# Patient Record
Sex: Male | Born: 1960 | Race: White | Hispanic: No | Marital: Married | Smoking: Current every day smoker
Health system: Southern US, Community
[De-identification: ages and names within clinical notes are randomized; demographics above are authoritative.]

## PROBLEM LIST (undated history)

## (undated) DIAGNOSIS — R112 Nausea with vomiting, unspecified: Secondary | ICD-10-CM

## (undated) DIAGNOSIS — T4145XA Adverse effect of unspecified anesthetic, initial encounter: Secondary | ICD-10-CM

## (undated) DIAGNOSIS — T8859XA Other complications of anesthesia, initial encounter: Secondary | ICD-10-CM

## (undated) DIAGNOSIS — I2510 Atherosclerotic heart disease of native coronary artery without angina pectoris: Secondary | ICD-10-CM

## (undated) DIAGNOSIS — J449 Chronic obstructive pulmonary disease, unspecified: Secondary | ICD-10-CM

## (undated) DIAGNOSIS — I10 Essential (primary) hypertension: Secondary | ICD-10-CM

## (undated) DIAGNOSIS — E781 Pure hyperglyceridemia: Secondary | ICD-10-CM

## (undated) DIAGNOSIS — E785 Hyperlipidemia, unspecified: Secondary | ICD-10-CM

## (undated) DIAGNOSIS — Z9889 Other specified postprocedural states: Secondary | ICD-10-CM

## (undated) DIAGNOSIS — E079 Disorder of thyroid, unspecified: Secondary | ICD-10-CM

## (undated) DIAGNOSIS — Z955 Presence of coronary angioplasty implant and graft: Secondary | ICD-10-CM

## (undated) DIAGNOSIS — I219 Acute myocardial infarction, unspecified: Secondary | ICD-10-CM

## (undated) HISTORY — PX: CORONARY STENT PLACEMENT: SHX1402

## (undated) HISTORY — PX: APPENDECTOMY: SHX54

## (undated) HISTORY — PX: EXTERNAL EAR SURGERY: SHX627

## (undated) HISTORY — DX: Pure hyperglyceridemia: E78.1

---

## 2010-06-08 DIAGNOSIS — Z955 Presence of coronary angioplasty implant and graft: Secondary | ICD-10-CM

## 2010-06-08 HISTORY — DX: Presence of coronary angioplasty implant and graft: Z95.5

## 2012-02-21 ENCOUNTER — Emergency Department (HOSPITAL_COMMUNITY)
Admission: EM | Admit: 2012-02-21 | Discharge: 2012-02-22 | Discharge status: Against Medical Advice | Payer: 59 | Attending: Emergency Medicine | Admitting: Emergency Medicine

## 2012-02-21 ENCOUNTER — Other Ambulatory Visit: Payer: Self-pay

## 2012-02-21 ENCOUNTER — Emergency Department (HOSPITAL_COMMUNITY): Payer: 59

## 2012-02-21 ENCOUNTER — Encounter (HOSPITAL_COMMUNITY): Payer: Self-pay | Admitting: *Deleted

## 2012-02-21 DIAGNOSIS — R079 Chest pain, unspecified: Secondary | ICD-10-CM | POA: Insufficient documentation

## 2012-02-21 DIAGNOSIS — I252 Old myocardial infarction: Secondary | ICD-10-CM | POA: Insufficient documentation

## 2012-02-21 DIAGNOSIS — E079 Disorder of thyroid, unspecified: Secondary | ICD-10-CM | POA: Insufficient documentation

## 2012-02-21 DIAGNOSIS — F17200 Nicotine dependence, unspecified, uncomplicated: Secondary | ICD-10-CM | POA: Insufficient documentation

## 2012-02-21 DIAGNOSIS — I2510 Atherosclerotic heart disease of native coronary artery without angina pectoris: Secondary | ICD-10-CM | POA: Insufficient documentation

## 2012-02-21 HISTORY — DX: Atherosclerotic heart disease of native coronary artery without angina pectoris: I25.10

## 2012-02-21 HISTORY — DX: Acute myocardial infarction, unspecified: I21.9

## 2012-02-21 HISTORY — DX: Disorder of thyroid, unspecified: E07.9

## 2012-02-21 LAB — POCT I-STAT TROPONIN I: Troponin i, poc: 0 ng/mL (ref 0.00–0.08)

## 2012-02-21 LAB — CBC
MCH: 31.5 pg (ref 26.0–34.0)
MCHC: 34.9 g/dL (ref 30.0–36.0)
MCV: 90.2 fL (ref 78.0–100.0)
Platelets: 225 10*3/uL (ref 150–400)
RBC: 4.8 MIL/uL (ref 4.22–5.81)
RDW: 13.9 % (ref 11.5–15.5)

## 2012-02-21 LAB — DIFFERENTIAL
Basophils Absolute: 0 10*3/uL (ref 0.0–0.1)
Basophils Relative: 0 % (ref 0–1)
Eosinophils Absolute: 0.1 10*3/uL (ref 0.0–0.7)
Eosinophils Relative: 2 % (ref 0–5)
Neutrophils Relative %: 56 % (ref 43–77)

## 2012-02-21 NOTE — ED Provider Notes (Addendum)
History     CSN: [id number]  Arrival date & time 02/21/12  2253   First MD Initiated Contact with Patient 02/21/12 2345      Chief Complaint  Patient presents with  . Chest Pain     Patient is a 51 y.o. male presenting with chest pain. The history is provided by the patient and the spouse.  Chest Pain The chest pain began more than 2 weeks ago. Episode Length: several hours. Chest pain occurs frequently. The chest pain is worsening. Associated with: nothing. The severity of the pain is moderate. The quality of the pain is described as pressure-like. The pain does not radiate. Exacerbated by: position. Pertinent negatives for primary symptoms include no syncope, no shortness of breath and no dizziness. He tried nitroglycerin and aspirin for the symptoms.   Pt with h/o CAD He reports for past month he has been having gradually worsening CP He denies significant SOB/diaphoresis/nausea/vomiting/weakness He has taken ASA today No recent travel/surgery Denies known h/o CAD/PE He reports this pain is similar to prior episode of CP with his MI  Past Medical History  Diagnosis Date  . Coronary artery disease   . Myocardial infarction   . Thyroid disease     Past Surgical History  Procedure Date  . Coronary stent placement     History reviewed. No pertinent family history.  History  Substance Use Topics  . Smoking status: Current Everyday Smoker  . Smokeless tobacco: Not on file  . Alcohol Use: No      Review of Systems  Respiratory: Negative for shortness of breath.   Cardiovascular: Positive for chest pain. Negative for syncope.  Neurological: Negative for dizziness.  All other systems reviewed and are negative.    Allergies  Review of patient's allergies indicates no known allergies.  Home Medications   Current Outpatient Rx  Name Route Sig Dispense Refill  . ASPIRIN EC 81 MG PO TBEC Oral Take 81 mg by mouth daily.    Marland Kitchen LEVOTHYROXINE SODIUM 100 MCG PO TABS  Oral Take 100 mcg by mouth daily.    Marland Kitchen METOPROLOL SUCCINATE ER 50 MG PO TB24 Oral Take 50 mg by mouth every evening. Take with or immediately following a meal.    . PRAVASTATIN SODIUM 40 MG PO TABS Oral Take 40 mg by mouth daily.      BP 139/73  Pulse 81  Temp(Src) 97.8 F (36.6 C) (Oral)  Resp 20  SpO2 98%  Physical Exam  CONSTITUTIONAL: Well developed/well nourished HEAD AND FACE: Normocephalic/atraumatic EYES: EOMI/PERRL ENMT: Mucous membranes moist NECK: supple no meningeal signs SPINE:entire spine nontender CV: S1/S2 noted, no murmurs/rubs/gallops noted LUNGS: Lungs are clear to auscultation bilaterally, no apparent distress ABDOMEN: soft, nontender, no rebound or guarding GU:no cva tenderness NEURO: Pt is awake/alert, moves all extremitiesx4 EXTREMITIES: pulses normal, full ROM, no edema noted SKIN: warm, color normal PSYCH: no abnormalities of mood noted   ED Course  Procedures   Labs Reviewed  CBC  DIFFERENTIAL  POCT I-STAT TROPONIN I  COMPREHENSIVE METABOLIC PANEL  CK TOTAL AND CKMB   11:57 PM Pt here with CP for past month He has h/o DES to left circumflex in 2011 (outside hospital) He reports he took effient for one yr, now on ASA He does not have local cardiologist He is CP free at this time  12:50 AM D/w dr Katha Cabal, cardiology He feels patient can be admitted to medicine  1:24 AM D/w dr Lovell Sheehan will admit to tele/OBS Pt  already took ASA today  1:33 AM Pt now decides to leave AMA I encouraged him to stay He will f/u as outpatient  I discussed risk of death/disability of leaving against medical advice and the patient accepts these risks.  The patient is awake/alet able to make decisions, and not intoxicated Patient discharged against medical advice.     MDM  Nursing notes reviewed and considered in documentation All labs/vitals reviewed and considered xrays reviewed and considered        Date: 02/21/2012  Rate: 75  Rhythm:  normal sinus rhythm  QRS Axis: normal  Intervals: normal  ST/T Wave abnormalities: normal  Conduction Disutrbances:none  Narrative Interpretation:   Old EKG Reviewed: none available    Joya Gaskins, MD 02/22/12 0125  Joya Gaskins, MD 02/22/12 [phone]

## 2012-02-21 NOTE — ED Notes (Signed)
The pt has had mid-chest pain intermittently for one month with some  sob

## 2012-02-22 LAB — COMPREHENSIVE METABOLIC PANEL
ALT: 22 U/L (ref 0–53)
Albumin: 3.7 g/dL (ref 3.5–5.2)
Alkaline Phosphatase: 99 U/L (ref 39–117)
Calcium: 9.3 mg/dL (ref 8.4–10.5)
GFR calc Af Amer: 79 mL/min — ABNORMAL LOW (ref >90)
Glucose, Bld: 113 mg/dL — ABNORMAL HIGH (ref 70–99)
Potassium: 3.6 mEq/L (ref 3.5–5.1)
Sodium: 135 mEq/L (ref 135–145)
Total Protein: 7 g/dL (ref 6.0–8.3)

## 2012-02-22 LAB — CK TOTAL AND CKMB (NOT AT ARMC)
Relative Index: 1.4 (ref 0.0–2.5)
Total CK: 321 U/L — ABNORMAL HIGH (ref 7–232)

## 2012-02-22 NOTE — ED Notes (Signed)
Patient complaining of chest pain for the last month; patient states that chest pain has been getting worse over the last week.  Denies shortness of breath, nausea, vomiting, and blurred vision.  Patient describes location of chest pain as "mid-sternal"; denies radiation of pain.  Describes chest pain as a "pressure"; pain worsens upon bending over and standing back up; rates pain 4/10.  Patient had MI a year and a half ago with stent placement; denies other cardiac history.  Patient alert and oriented x4; PERRL present.  Will continue to monitor.

## 2012-02-22 NOTE — Discharge Instructions (Signed)
Aspirin and Your Heart Aspirin affects the way your blood clots and helps "thin" the blood. Aspirin has many uses in heart disease. It may be used as a primary prevention to help reduce the risk of heart related events. It also can be used as a secondary measure to prevent more heart attacks or to prevent additional damage from blood clots.  ASPIRIN MAY HELP IF YOU:  Have had a heart attack or chest pain.   Have undergone open heart surgery such as CABG (Coronary Artery Bypass Surgery).   Have had coronary angioplasty with or without stents.   Have experienced a stroke or TIA (transient ischemic attack).   Have peripheral vascular disease (PAD).   Have chronic heart rhythm problems such as atrial fibrillation.   Are at risk for heart disease.  BEFORE STARTING ASPIRIN Before you start taking aspirin, your caregiver will need to review your medical history. Many things will need to be taken into consideration, such as:  Smoking status.   Blood pressure.   Diabetes.   Gender.   Weight.   Cholesterol level.  ASPIRIN DOSES  Aspirin should only be taken on the advice of your caregiver. Talk to your caregiver about how much aspirin you should take. Aspirin comes in different doses such as:   81 mg.   162 mg.   325 mg.   The aspirin dose you take may be affected by many factors, some of which include:   Your current medications, especially if your are taking blood-thinners or anti-platelet medicine.   Liver function.   Heart disease risk.   Age.   Aspirin comes in two forms:   Non-enteric-coated. This type of aspirin does not have a coating and is absorbed faster. Non-enteric coated aspirin is recommended for patients experiencing chest pain symptoms. This type of aspirin also comes in a chewable form.   Enteric-coated. This means the aspirin has a special coating that releases the medicine very slowly. Enteric-coated aspirin causes less stomach upset. This type of  aspirin should not be chewed or crushed.  ASPIRIN SIDE EFFECTS Daily use of aspirin can increase your risk of serious side effects, some of these include:  Increased bleeding. This can range from a cut that does not stop bleeding to more serious problems such as stomach bleeding or bleeding into the brain (Intracerebral bleeding).   Increased bruising.   Stomach upset.   An allergic reaction such as red, itchy skin.   Increased risk of bleeding when combined with non-steroidal anti-inflammatory medicine (NSAIDS).   Alcohol should be drank in moderation when taking aspirin. Alcohol can increase the risk of stomach bleeding when taken with aspirin.   Aspirin should not be given to children less than 18 years of age due to the association of Reye syndrome. Reye syndrome is a serious illness that can affect the brain and liver. Studies have linked Reye syndrome with aspirin use in children.   People that have nasal polyps have an increased risk of developing an aspirin allergy.  SEEK MEDICAL CARE IF:   You develop an allergic reaction such as:   Hives.   Itchy skin.   Swelling of the lips, tongue or face.   You develop stomach pain.   You have unusual bleeding or bruising.   You have ringing in your ears.  SEEK IMMEDIATE MEDICAL CARE IF:   You have severe chest pain, especially if the pain is crushing or pressure-like and spreads to the arms, back, neck, or jaw. THIS   IS AN EMERGENCY. Do not wait to see if the pain will go away. Get medical help at once. Call your local emergency services (911 in the [place] DO NOT drive yourself to the hospital.   You have stroke-like symptoms such as:   Loss of vision.   Difficulty talking.   Numbness or weakness on one side of your body.   Numbness or weakness in your arm or leg.   Not thinking clearly or feeling confused.   Your bowel movements are bloody, dark red or black in color.   You vomit or cough up blood.   You have blood  in your urine.   You have shortness of breath, coughing or wheezing.  MAKE SURE YOU:   Understand these instructions.   Will monitor your condition.   Seek immediate medical care if necessary.  Document Released: 11/07/2008 Document Revised: 11/14/2011 Document Reviewed: 11/07/2008 [facility] Patient Information 2012 [place], [place].

## 2012-02-25 ENCOUNTER — Other Ambulatory Visit: Payer: Self-pay | Admitting: Cardiology

## 2012-02-25 ENCOUNTER — Encounter (HOSPITAL_COMMUNITY): Payer: Self-pay | Admitting: Pharmacy Technician

## 2012-02-26 ENCOUNTER — Ambulatory Visit (HOSPITAL_COMMUNITY)
Admission: RE | Admit: 2012-02-26 | Discharge: 2012-02-26 | Disposition: A | Discharge status: Home / Self Care | Payer: 59 | Source: Ambulatory Visit | Attending: Cardiology | Admitting: Cardiology

## 2012-02-26 ENCOUNTER — Encounter (HOSPITAL_COMMUNITY)
Admission: RE | Disposition: A | Discharge status: Home / Self Care | Payer: Self-pay | Source: Ambulatory Visit | Attending: Cardiology

## 2012-02-26 ENCOUNTER — Encounter (HOSPITAL_COMMUNITY): Payer: Self-pay | Admitting: Cardiology

## 2012-02-26 DIAGNOSIS — I219 Acute myocardial infarction, unspecified: Secondary | ICD-10-CM | POA: Diagnosis present

## 2012-02-26 DIAGNOSIS — J449 Chronic obstructive pulmonary disease, unspecified: Secondary | ICD-10-CM | POA: Diagnosis present

## 2012-02-26 DIAGNOSIS — I10 Essential (primary) hypertension: Secondary | ICD-10-CM | POA: Insufficient documentation

## 2012-02-26 DIAGNOSIS — E079 Disorder of thyroid, unspecified: Secondary | ICD-10-CM | POA: Diagnosis present

## 2012-02-26 DIAGNOSIS — I252 Old myocardial infarction: Secondary | ICD-10-CM | POA: Insufficient documentation

## 2012-02-26 DIAGNOSIS — E785 Hyperlipidemia, unspecified: Secondary | ICD-10-CM | POA: Insufficient documentation

## 2012-02-26 DIAGNOSIS — R079 Chest pain, unspecified: Secondary | ICD-10-CM | POA: Insufficient documentation

## 2012-02-26 DIAGNOSIS — F17200 Nicotine dependence, unspecified, uncomplicated: Secondary | ICD-10-CM | POA: Insufficient documentation

## 2012-02-26 DIAGNOSIS — J4489 Other specified chronic obstructive pulmonary disease: Secondary | ICD-10-CM | POA: Insufficient documentation

## 2012-02-26 DIAGNOSIS — I2510 Atherosclerotic heart disease of native coronary artery without angina pectoris: Secondary | ICD-10-CM | POA: Insufficient documentation

## 2012-02-26 DIAGNOSIS — Z955 Presence of coronary angioplasty implant and graft: Secondary | ICD-10-CM | POA: Diagnosis present

## 2012-02-26 HISTORY — DX: Presence of coronary angioplasty implant and graft: Z95.5

## 2012-02-26 HISTORY — DX: Hyperlipidemia, unspecified: E78.5

## 2012-02-26 HISTORY — DX: Essential (primary) hypertension: I10

## 2012-02-26 HISTORY — DX: Chronic obstructive pulmonary disease, unspecified: J44.9

## 2012-02-26 HISTORY — PX: LEFT HEART CATHETERIZATION WITH CORONARY ANGIOGRAM: SHX5451

## 2012-02-26 LAB — PROTIME-INR: Prothrombin Time: 13 seconds (ref 11.6–15.2)

## 2012-02-26 SURGERY — LEFT HEART CATHETERIZATION WITH CORONARY ANGIOGRAM
Anesthesia: LOCAL

## 2012-02-26 MED ORDER — LIDOCAINE HCL (PF) 1 % IJ SOLN
INTRAMUSCULAR | Status: AC
  Filled 2012-02-26: qty 30

## 2012-02-26 MED ORDER — SODIUM CHLORIDE 0.9 % IJ SOLN
3.0000 mL | INTRAMUSCULAR | Status: DC | PRN
  Administered 2012-02-26: 3 mL via INTRAVENOUS

## 2012-02-26 MED ORDER — HEPARIN SODIUM (PORCINE) 1000 UNIT/ML IJ SOLN
INTRAMUSCULAR | Status: AC
  Filled 2012-02-26: qty 1

## 2012-02-26 MED ORDER — DIAZEPAM 5 MG PO TABS
5.0000 mg | ORAL_TABLET | ORAL | Status: AC
  Administered 2012-02-26: 5 mg via ORAL
  Filled 2012-02-26: qty 1

## 2012-02-26 MED ORDER — SODIUM CHLORIDE 0.9 % IV SOLN: 1.0000 mL/kg/h | INTRAVENOUS | Status: AC

## 2012-02-26 MED ORDER — MORPHINE SULFATE 4 MG/ML IJ SOLN: 1.0000 mg | INTRAMUSCULAR | Status: DC | PRN

## 2012-02-26 MED ORDER — FENTANYL CITRATE 0.05 MG/ML IJ SOLN
INTRAMUSCULAR | Status: AC
  Filled 2012-02-26: qty 2

## 2012-02-26 MED ORDER — ACETAMINOPHEN 325 MG PO TABS: 650.0000 mg | ORAL_TABLET | ORAL | Status: DC | PRN

## 2012-02-26 MED ORDER — SODIUM CHLORIDE 0.9 % IV SOLN
INTRAVENOUS | Status: DC
  Administered 2012-02-26: 09:00:00 via INTRAVENOUS

## 2012-02-26 MED ORDER — MIDAZOLAM HCL 2 MG/2ML IJ SOLN
INTRAMUSCULAR | Status: AC
  Filled 2012-02-26: qty 2

## 2012-02-26 MED ORDER — NITROGLYCERIN 0.2 MG/ML ON CALL CATH LAB
INTRAVENOUS | Status: AC
  Filled 2012-02-26: qty 1

## 2012-02-26 MED ORDER — HEPARIN (PORCINE) IN NACL 2-0.9 UNIT/ML-% IJ SOLN
INTRAMUSCULAR | Status: AC
  Filled 2012-02-26: qty 1000

## 2012-02-26 MED ORDER — ONDANSETRON HCL 4 MG/2ML IJ SOLN: 4.0000 mg | Freq: Four times a day (QID) | INTRAMUSCULAR | Status: DC | PRN

## 2012-02-26 NOTE — CV Procedure (Signed)
 [facility]     CARDIAC CATHETERIZATION REPORT  NAME: Kenneth Maddox   MRN: [id number] DOB: [birth year]   ADMIT DATE:  02/26/2012  Performing Cardiologist: Marykay Lex  Primary Physician: No primary provider on file. Primary Cardiologist:  Governor Rooks, M.D.  Procedures Performed:  Left Heart Catheterization via 5 Fr Right Radial Artery access  Left Ventriculography, (RAO) 11 ml/sec for 33 ml total contrast  Native Coronary Angiography  Indication(s): Chest pain the rest exertion concerning for crescendo angina  History: 51 y.o. male with past history is notable for known coronary disease status post MI and 06/2010 treated with a Taxus Ion 3.0 mm 60 mm DES stent the proximal dominant Left Circumflex. He also has COPD he is a chronic smoker, hypertension and hyperlipidemia as well as hypothyroidism. He [facility] yesterday on 02/25/2012 with complaint of worsening chest pain over last month. If she was seen at most in emergency room this past Friday and refuse admission, leaving AMA. He did rule out for MI at that time.  Based on the nature of his symptoms it is no history and being off his medications Dr. Alanda Amass felt this was concerning for crescendo angina and refer the patient for invasive evaluation with cardiac catheterization. In anticipation of possible PCI started Effient with a 60 mg load last night followed by 10 mg daily.  Consent: The procedure with Risks/Benefits/Alternatives and Indications was reviewed with the patient.  All questions were answered.    Risks / Complications include, but not limited to: Death, MI, CVA/TIA, VF/VT (with defibrillation), Bradycardia (need for temporary pacer placement), contrast induced nephropathy, bleeding / bruising / hematoma / pseudoaneurysm, vascular or coronary injury (with possible emergent CT or Vascular Surgery), adverse medication reactions,  infection.    The patient voiced understanding and agree to proceed.    Consent for signed by MD and patient with RN witness -- placed on chart.  Procedure: The patient was brought to the 2nd Floor [facility] Catheterization Lab in the fasting state and prepped and draped in the usual sterile fashion for (Right groin or radial) access.  A modified Allen's test with plethysmography was performed on the right wrist demonstrating adequate Ulnar Artery collateral flow.    Sterile technique was used including antiseptics, cap, gloves, gown, hand hygiene, mask and sheet.  Skin prep: Chlorhexidine;  Time Out: Verified patient identification, verified procedure, site/side was marked, verified correct patient position, special equipment/implants available, medications/allergies/relevent history reviewed, required imaging and test results available.  Performed  The right wrist was anesthetized with 1% subcutaneous Lidocaine.  The right radial artery was accessed using the Seldinger Technique with placement of a 5  Fr Glide Sheath. The sheath was aspirated and flushed.  Then a total of 10  ml of standard Radial Artery Cocktail (see medications) was infused.  Radial Cocktail: 2.5 mg Nicardipine, 400 mcg NTG, 2 ml 2% Lidocaine  A 5  Fr TIG 4.0  Catheter was advanced of over a Versicore wire into the ascending Aorta.  The catheter was used to engage both the Left and Right Coronary Arteries.  Multiple cineangiographic views of  both coronary artery system(s) were performed.   This catheter was then exchanged over the Long Exchange Safety J wire for an angled Pigtail catheter that was advanced across the Aortic Valve.  LV hemodynamics were measured and  Left Ventriculography was performed.  LV hemodynamics were then re-sampled, and  the catheter was pulled back across the Aortic Valve for measurement of "pull-back" gradient.  The catheter and wire were removed completely out of the body.  The sheath was  removed in the Cath Lab with a TR band placed at 12 ml Air at 1133 hrs (time).  Reverse Allen's test revealed non-occlusive hemostasis.  The patient was transported to the PACU in hemodynamically stable / chest pain free condition.   The patient  was stable before, during and following the procedure.   Patient did tolerate procedure well. There were not complications.  EBL: < 5 ml  Medications:  Premedication: 5mg   Valium,   Sedation:  2 mg IV Versed, 75 mcg IV Fentanyl  Contrast:  100 Omnipaque  Radial Cocktail: 2.5 mg Nicardipine, 400 mcg NTG, 2 ml 2% Lidocaine --> in 10ml NS  IV Heparin:  5000 Units  Hemodynamics:  Central Aortic Pressure / Mean Aortic Pressure: 102/67  mmHg ; 82  mmHg  LV Pressure / LV End diastolic Pressure:  109/5  mmHg ; 11  mmHg  Left Ventriculography:  EF:  60-65%  Wall Motion: Normal, no regional wall motion abnormalities  Coronary Angiographic Data:  Left Dominant System  Left Main:  Short large-caliber bifurcates into LAD and Large Dominant Circumflex   Left Anterior Descending (LAD):  Proximal eccentric 30-40% lesion prior to first septal perforator and diagonal branch. The vessel then tapers down after giving rise to a large branching diagonal branch. Then after a large septal trunk, it bifurcates into a double barrel vessel with a long septal branch that gives off small septal perforators in the distal LAD which is small caliber vessel tapering down to the apex. His minimal luminal irregularities downstream just at the bifurcation of this in the septal trunk and distal LAD there are 2 focal 20-30% lesions in a bend point.   Major Diagonal Branch  (D1):  Large major vessel coming off the mid LAD it branches into a larger more anterior as well as a smaller lateral branch. The larger of the 2 branches in the morning here branches it courses down to almost the apex. Her mental luminal irregularities in this vessel system.   Large Dominant Circumflex (LCx):   Large caliber, dominant vessel giving rise to both the posterior descending artery and its lateral system. The possible segment has a widely patent Taxus ion 3.0 mm 60 mm DES stent placed at the time of his MI. Just after the stent the vessel bifurcates into a large OM and atrial ventricular groove circumflex.   1st obtuse marginal:  Moderate caliber vessel with a proximal 40% lesion before before bifurcates into 2 smaller moderate-caliber branches reaching down to the apex.  Minimal luminal irregularities.   2nd obtuse marginal:  Small caliber vessel comes off just after OM 1 has ostial 80% lesion that is not PCI amenable lesion as the vessel is roughly 1-1.5 mm diameter. The downstream vessel has minimal luminal irregularities.   AV groove circumflex: After OM 2 there is a tubular 40-50% lesion in the history of present illness or groove before normalizing and then bifurcating into the large posterior lateral branch and posterior descending artery.   posterior lateral branch:  Moderate caliber vessel with multiple branches reaches along the inferolateral border toward the apex. Minimal luminal irregularities.   posterior descending artery: Moderate caliber vessel, diffusely luminal irregularities ranging from 20-30% proximally before coursing out to the posterior descending branch. Otherwise minimal luminal irregularities.   Right Coronary Artery: Small nondominant  vessel it gives rise to artery marginal branches. No notable disease in this vessel system.  Impression:  No evidence of obstructive disease explain the patient's chest pain.  Widely patent proximal Circumflex DES Stent with moderate disease in the AV groove circumflex.   Severe ostial lesion the OM 2 not amenable PCI may be the only potential candidate for a couple lesion.   Preserved left ejection fraction normally EDP.   Plan:  Standard post-radial cath care; anticipate discharge to home in the afternoon.  Continued  optimize medical therapy. We'll reinitiate beta blocker statin aspirin plus Effient.  He'll followup with Dr. Alanda Amass at [facility] as previous scheduled.  The case and results was discussed with the patient (and family).  The case and results was discussed with the patient's Cardiologist.  Time Spend Directly with Patient:  45 minutes  , W, M.D., M.S. [facility] [address] [place], [place]  [address]  [phone]  02/26/2012 12:05 PM

## 2012-02-26 NOTE — H&P (Signed)
History and Physical Interval Note:  NAME:  Kenneth Maddox   MRN: [id number] DOB:  1961-11-22   ADMIT DATE: 02/26/2012   02/26/2012 10:34 AM  Kenneth Maddox is a 51 y.o. male with history of known coronary disease status post MI in July 2011 document below, COPD, hyperlipidemia and hypertension who is a chronic smoker. He even relatively well since his intervention to his circumflex artery in 211 however last month or so complaining of a chest pain getting worse over the last few weeks. No association of breath nausea vomiting or blurred vision. He had midsternal pain  with no radiation. He describes as a pressure sensation. He was seen by Dr. Alanda Amass and [facility] vascular on March 19 and was felt to be having symptoms consistent with crescendo/unstable angina. He was therefore referred for invasive coronary evaluation with cardiac catheterization.  He has been placed back on Effient - started last PM.  See Dr. Kandis Cocking dictated note placed in the shadow chart for more details.  Past Medical History  Diagnosis Date  . Coronary artery disease   . Myocardial infarction   . Thyroid disease   . Hyperlipidemia LDL goal < 70   . Presence of stent in left circumflex coronary artery 06/2010    Ion EES 3.0 mm x 16 mm; proximal left circumflex  . Hypertension   . COPD (chronic obstructive pulmonary disease)     Past Surgical History  Procedure Date  . Coronary stent placement    SOCHx:  reports that he has been smoking.  He does not have any smokeless tobacco history on file. He reports that he does not drink alcohol. His drug history not on file.  ALLERGIES: No Known Allergies  HOME MEDICATIONS: Prescriptions prior to admission  Medication Sig Dispense Refill  . aspirin EC 81 MG tablet Take 81 mg by mouth daily.      Marland Kitchen levothyroxine (SYNTHROID, LEVOTHROID) 100 MCG tablet Take 100 mcg by mouth every evening.       . metoprolol succinate (TOPROL-XL) 50 MG 24 hr tablet Take 50 mg by  mouth every evening. Take with or immediately following a meal.      . OVER THE COUNTER MEDICATION Take 1 tablet by mouth daily as needed. Acid reducer tablet -  For acid reflux      . prasugrel (EFFIENT) 10 MG TABS Take 60 mg by mouth once.      . pravastatin (PRAVACHOL) 40 MG tablet Take 40 mg by mouth daily.        PHYSICAL EXAM:Blood pressure 134/82, pulse 84, temperature 96.9 F (36.1 C), temperature source Oral, resp. rate 18, height 6\' 4"  (1.93 m), weight 97.07 kg (214 lb), SpO2 99.00%. General appearance: alert, cooperative, appears stated age and Very poor dentition Neck: no adenopathy, no carotid bruit, no JVD, supple, symmetrical, trachea midline and thyroid not enlarged, symmetric, no tenderness/mass/nodules Lungs: diminished breath sounds bilaterally and Diffuse interstitial sounds Heart: regular rate and rhythm, S1, S2 normal, no murmur, click, rub or gallop and Very distant heart sounds Abdomen: soft, non-tender; bowel sounds normal; no masses,  no organomegaly Extremities: extremities normal, atraumatic, no cyanosis or edema Pulses: Bilateral radial pulses are bounding. There is obvious posterior pulses bilaterally are trace to 1+ Neurologic: Grossly normal  IMPRESSION & PLAN The patients' history has been reviewed, patient examined, no change in status from most recent note (Dr. Alanda Amass - dated 02/25/2012).  He is stable for cardiac catheterization. I have reviewed the patients' chart and labs. Questions  were answered to the patient's satisfaction.    [name] has presented today for surgery, with the diagnosis of chest pain The various methods of treatment have been discussed with the patient and family. After consideration of risks, benefits and other options for treatment, the patient has consented to Procedure(s):  LEFT HEART CATHETERIZATION AND CORONARY ANGIOGRAPHY +/- AD HOC PERCUTANEOUS CORONARY INTERVENTION  as a surgical intervention.   We will proceed with  the planned procedure.   [name] W [facility] [address] [place], [place]  [address]  [phone]  02/26/2012 10:34 AM

## 2012-02-26 NOTE — Discharge Instructions (Signed)
Radial Site Care Refer to this sheet in the next few weeks. These instructions provide you with information on caring for yourself after your procedure. Your caregiver may also give you more specific instructions. Your treatment has been planned according to current medical practices, but problems sometimes occur. Call your caregiver if you have any problems or questions after your procedure. HOME CARE INSTRUCTIONS  You may shower the day after the procedure.Remove the bandage (dressing) and gently wash the site with plain soap and water.Gently pat the site dry.   Do not apply powder or lotion to the site.   Do not submerge the affected site in water for 3 to 5 days.   Inspect the site at least twice daily.   Do not flex or bend the affected arm for 24 hours.   No lifting over 5 pounds (2.3 kg) for 5 days after your procedure.   Do not drive home if you are discharged the same day of the procedure. Have someone else drive you.   You may drive 24 hours after the procedure unless otherwise instructed by your caregiver.   Do not operate machinery or power tools for 24 hours.   A responsible adult should be with you for the first 24 hours after you arrive home.  What to expect:  Any bruising will usually fade within 1 to 2 weeks.   Blood that collects in the tissue (hematoma) may be painful to the touch. It should usually decrease in size and tenderness within 1 to 2 weeks.  SEEK IMMEDIATE MEDICAL CARE IF:  You have unusual pain at the radial site.   You have redness, warmth, swelling, or pain at the radial site.   You have drainage (other than a small amount of blood on the dressing).   You have chills.   You have a fever or persistent symptoms for more than 72 hours.   You have a fever and your symptoms suddenly get worse.   Your arm becomes pale, cool, tingly, or numb.   You have heavy bleeding from the site. Hold pressure on the site.  Document Released: 12/28/2010  Document Revised: 11/14/2011 Document Reviewed: 12/28/2010 [employer] Patient Information 2012 [employer]. 

## 2012-02-27 MED FILL — Nicardipine HCl IV Soln 2.5 MG/ML: INTRAVENOUS | Qty: 1 | Status: AC

## 2014-05-08 ENCOUNTER — Emergency Department (HOSPITAL_COMMUNITY)
Admission: EM | Admit: 2014-05-08 | Discharge: 2014-05-08 | Disposition: A | Discharge status: Home / Self Care | Payer: BC Managed Care – PPO | Attending: Emergency Medicine | Admitting: Emergency Medicine

## 2014-05-08 ENCOUNTER — Encounter (HOSPITAL_COMMUNITY): Payer: Self-pay | Admitting: Emergency Medicine

## 2014-05-08 ENCOUNTER — Emergency Department (HOSPITAL_COMMUNITY): Payer: BC Managed Care – PPO

## 2014-05-08 DIAGNOSIS — I2510 Atherosclerotic heart disease of native coronary artery without angina pectoris: Secondary | ICD-10-CM | POA: Insufficient documentation

## 2014-05-08 DIAGNOSIS — Z791 Long term (current) use of non-steroidal anti-inflammatories (NSAID): Secondary | ICD-10-CM | POA: Insufficient documentation

## 2014-05-08 DIAGNOSIS — J449 Chronic obstructive pulmonary disease, unspecified: Secondary | ICD-10-CM | POA: Insufficient documentation

## 2014-05-08 DIAGNOSIS — Z862 Personal history of diseases of the blood and blood-forming organs and certain disorders involving the immune mechanism: Secondary | ICD-10-CM | POA: Insufficient documentation

## 2014-05-08 DIAGNOSIS — F17200 Nicotine dependence, unspecified, uncomplicated: Secondary | ICD-10-CM | POA: Insufficient documentation

## 2014-05-08 DIAGNOSIS — I252 Old myocardial infarction: Secondary | ICD-10-CM | POA: Insufficient documentation

## 2014-05-08 DIAGNOSIS — Z9861 Coronary angioplasty status: Secondary | ICD-10-CM | POA: Insufficient documentation

## 2014-05-08 DIAGNOSIS — I10 Essential (primary) hypertension: Secondary | ICD-10-CM | POA: Insufficient documentation

## 2014-05-08 DIAGNOSIS — R0789 Other chest pain: Secondary | ICD-10-CM

## 2014-05-08 DIAGNOSIS — Z8639 Personal history of other endocrine, nutritional and metabolic disease: Secondary | ICD-10-CM | POA: Insufficient documentation

## 2014-05-08 DIAGNOSIS — J4489 Other specified chronic obstructive pulmonary disease: Secondary | ICD-10-CM | POA: Insufficient documentation

## 2014-05-08 DIAGNOSIS — R071 Chest pain on breathing: Secondary | ICD-10-CM | POA: Insufficient documentation

## 2014-05-08 MED ORDER — NAPROXEN 500 MG PO TABS: 500.0000 mg | ORAL_TABLET | Freq: Two times a day (BID) | ORAL | Status: DC

## 2014-05-08 MED ORDER — CYCLOBENZAPRINE HCL 10 MG PO TABS: 10.0000 mg | ORAL_TABLET | Freq: Two times a day (BID) | ORAL | Status: DC | PRN

## 2014-05-08 NOTE — ED Notes (Signed)
Pt alert & oriented x4, stable gait. Patient given discharge instructions, paperwork & prescription(s). Patient  instructed to stop at the registration desk to finish any additional paperwork. Patient verbalized understanding. Pt left department w/ no further questions. 

## 2014-05-08 NOTE — ED Notes (Signed)
Pt reports last week had the worst cold of his life.  Reports had cough and fever.  Denies cough and fever at present but c/o pain in left rib area.  Pt says the area is not tender and movement doesn't make it better or worse.

## 2014-05-08 NOTE — ED Provider Notes (Addendum)
CSN: [id number]     Arrival date & time 05/08/14  1016 History  This chart was scribed for Donnetta Hutching, MD by Dorothey Baseman, ED Scribe. This patient was seen in room APA15/APA15 and the patient's care was started at 12:51 PM.    Chief Complaint  Patient presents with  . rib pain    The history is provided by the patient. No language interpreter was used.   HPI Comments: Kenneth Maddox is a 53 y.o. Male who presents to the Emergency Department complaining of a waxing and waning pain to the left, inferior, anterior, lateral ribs onset this morning that he states has been gradually improving. He denies any exacerbating or alleviating factors. Patient reports an associated productive cough with green-colored sputum and fever (patient is afebrile at 97.8 in the ED) last week and states he is still currently taking a course of Keflex. Patient states that these symptoms have since resolved. Patient also has a history of thyroid disease. No dyspnea, diaphoresis, nausea. Symptoms have disappeared. He does not feel this pain is related to his heart. He points to an area just inferior to his left inferior rib cage  Past Medical History  Diagnosis Date  . Coronary artery disease   . Myocardial infarction   . Thyroid disease   . Hyperlipidemia LDL goal < 70   . Presence of stent in left circumflex coronary artery 06/2010    Ion EES 3.0 mm x 16 mm; proximal left circumflex  . Hypertension   . COPD (chronic obstructive pulmonary disease)    Past Surgical History  Procedure Laterality Date  . Coronary stent placement    . External ear surgery     No family history on file. History  Substance Use Topics  . Smoking status: Current Every Day Smoker  . Smokeless tobacco: Not on file  . Alcohol Use: No    Review of Systems  A complete 10 system review of systems was obtained and all systems are negative except as noted in the HPI and PMH.    Allergies  Review of patient's allergies indicates no known  allergies.  Home Medications   Prior to Admission medications   Medication Sig Start Date End Date Taking? Authorizing Provider  cephALEXin (KEFLEX) 500 MG capsule Take 500 mg by mouth 3 (three) times daily.   Yes Historical Provider, MD  ibuprofen (ADVIL,MOTRIN) 200 MG tablet Take 400 mg by mouth every 6 (six) hours as needed for mild pain.   Yes Historical Provider, MD  cyclobenzaprine (FLEXERIL) 10 MG tablet Take 1 tablet (10 mg total) by mouth 2 (two) times daily as needed for muscle spasms. 05/08/14   Donnetta Hutching, MD  naproxen (NAPROSYN) 500 MG tablet Take 1 tablet (500 mg total) by mouth 2 (two) times daily. 05/08/14   Donnetta Hutching, MD   Triage Vitals: BP 138/94  Pulse 72  Temp(Src) 97.8 F (36.6 C) (Oral)  Resp 18  Ht 6\' 4"  (1.93 m)  Wt 210 lb (95.255 kg)  BMI 25.57 kg/m2  SpO2 99%  Physical Exam  Nursing note and vitals reviewed. Constitutional: He is oriented to person, place, and time. He appears well-developed and well-nourished.  HENT:  Head: Normocephalic and atraumatic.  Eyes: Conjunctivae and EOM are normal. Pupils are equal, round, and reactive to light.  Neck: Normal range of motion. Neck supple.  Cardiovascular: Normal rate, regular rhythm and normal heart sounds.   Pulmonary/Chest: Effort normal and breath sounds normal. He exhibits tenderness.  Minimal tenderness  to superior, inferior, lateral ribs.   Abdominal: Soft. Bowel sounds are normal.  Musculoskeletal: Normal range of motion.  Neurological: He is alert and oriented to person, place, and time.  Skin: Skin is warm and dry.  Psychiatric: He has a normal mood and affect. His behavior is normal.    ED Course  Procedures (including critical care time)  DIAGNOSTIC STUDIES: Oxygen Saturation is 99% on room air, normal by my interpretation.    COORDINATION OF CARE: 10:27 AM- Ordered an x-ray of the left ribs.   12:58 PM- Discussed that x-ray results were negative for fracture or other injury and that  symptoms are likely muscular in nature. Discussed that concern for cardiac problems are low and patient agrees. Will discharge patient with NSAIDs and muscle relaxants to manage symptoms. Discussed treatment plan with patient at bedside and patient verbalized agreement.     Labs Review Labs Reviewed - No data to display  Imaging Review Dg Ribs Unilateral W/chest Left  05/08/2014   CLINICAL DATA:  Left anterior rib pain  EXAM: LEFT RIBS AND CHEST - 3+ VIEW  COMPARISON:  Chest radiographs dated 02/21/2012  FINDINGS: Lungs are essentially clear. No focal consolidation. No pleural effusion or pneumothorax.  The heart is normal in size.  No displaced left rib fracture is seen.  IMPRESSION: No evidence of acute cardiopulmonary disease.  No displaced left rib fracture is seen.   Electronically Signed   By: Charline BillsSriyesh  Krishnan M.D.   On: 05/08/2014 11:11     EKG Interpretation None      MDM   Final diagnoses:  Chest wall pain    History is not consistent with cardiac pain. Patient has had a recent upper respiratory infection and I suspect a chest wall strain.  Patient is hemodynamically stable. Rx Naprosyn 500 mg and Flexeril 10 mg  I personally performed the services described in this documentation, which was scribed in my presence. The recorded information has been reviewed and is accurate.     Donnetta HutchingBrian Jermane Brayboy, MD 05/08/14 1446  Donnetta HutchingBrian Rainelle Sulewski, MD 05/08/14 [id number]

## 2014-05-08 NOTE — Discharge Instructions (Signed)
Chest Wall Pain Chest wall pain is pain felt in or around the chest bones and muscles. It may take up to 6 weeks to get better. It may take longer if you are active. Chest wall pain can happen on its own. Other times, things like germs, injury, coughing, or exercise can cause the pain. HOME CARE   Avoid activities that make you tired or cause pain. Try not to use your chest, belly (abdominal), or side muscles. Do not use heavy weights.  Put ice on the sore area.  Put ice in a plastic bag.  Place a towel between your skin and the bag.  Leave the ice on for 15-20 minutes for the first 2 days.  Only take medicine as told by your doctor. GET HELP RIGHT AWAY IF:   You have more pain or are very uncomfortable.  You have a fever.  Your chest pain gets worse.  You have new problems.  You feel sick to your stomach (nauseous) or throw up (vomit).  You start to sweat or feel lightheaded.  You have a cough with mucus (phlegm).  You cough up blood. MAKE SURE YOU:   Understand these instructions.  Will watch your condition.  Will get help right away if you are not doing well or get worse. Document Released: 05/13/2008 Document Revised: 02/17/2012 Document Reviewed: 07/22/2011 [employer] Patient Information 2014 [place], [place].  Chest x-ray showed no acute problems. Medication for pain and muscle spasm. Return if worse.

## 2014-11-17 ENCOUNTER — Encounter (HOSPITAL_COMMUNITY): Payer: Self-pay | Admitting: Cardiology

## 2015-11-16 ENCOUNTER — Emergency Department (HOSPITAL_COMMUNITY): Payer: Managed Care, Other (non HMO)

## 2015-11-16 ENCOUNTER — Observation Stay (HOSPITAL_COMMUNITY)
Admission: EM | Admit: 2015-11-16 | Discharge: 2015-11-17 | Disposition: A | Discharge status: Home / Self Care | Payer: Managed Care, Other (non HMO) | Attending: General Surgery | Admitting: General Surgery

## 2015-11-16 ENCOUNTER — Encounter (HOSPITAL_COMMUNITY)
Admission: EM | Disposition: A | Discharge status: Home / Self Care | Payer: Self-pay | Source: Home / Self Care | Attending: Emergency Medicine

## 2015-11-16 ENCOUNTER — Observation Stay (HOSPITAL_COMMUNITY): Payer: Managed Care, Other (non HMO) | Admitting: Anesthesiology

## 2015-11-16 ENCOUNTER — Encounter (HOSPITAL_COMMUNITY): Payer: Self-pay | Admitting: Emergency Medicine

## 2015-11-16 DIAGNOSIS — K3580 Unspecified acute appendicitis: Secondary | ICD-10-CM | POA: Diagnosis not present

## 2015-11-16 DIAGNOSIS — Z79899 Other long term (current) drug therapy: Secondary | ICD-10-CM | POA: Insufficient documentation

## 2015-11-16 DIAGNOSIS — I2510 Atherosclerotic heart disease of native coronary artery without angina pectoris: Secondary | ICD-10-CM | POA: Diagnosis not present

## 2015-11-16 DIAGNOSIS — F17200 Nicotine dependence, unspecified, uncomplicated: Secondary | ICD-10-CM | POA: Diagnosis not present

## 2015-11-16 DIAGNOSIS — Z955 Presence of coronary angioplasty implant and graft: Secondary | ICD-10-CM | POA: Diagnosis not present

## 2015-11-16 DIAGNOSIS — I252 Old myocardial infarction: Secondary | ICD-10-CM | POA: Diagnosis not present

## 2015-11-16 DIAGNOSIS — I10 Essential (primary) hypertension: Secondary | ICD-10-CM | POA: Insufficient documentation

## 2015-11-16 DIAGNOSIS — E785 Hyperlipidemia, unspecified: Secondary | ICD-10-CM | POA: Diagnosis not present

## 2015-11-16 DIAGNOSIS — Z791 Long term (current) use of non-steroidal anti-inflammatories (NSAID): Secondary | ICD-10-CM | POA: Insufficient documentation

## 2015-11-16 DIAGNOSIS — E079 Disorder of thyroid, unspecified: Secondary | ICD-10-CM | POA: Insufficient documentation

## 2015-11-16 DIAGNOSIS — R1031 Right lower quadrant pain: Secondary | ICD-10-CM | POA: Diagnosis present

## 2015-11-16 DIAGNOSIS — J449 Chronic obstructive pulmonary disease, unspecified: Secondary | ICD-10-CM | POA: Diagnosis not present

## 2015-11-16 DIAGNOSIS — K37 Unspecified appendicitis: Secondary | ICD-10-CM | POA: Diagnosis present

## 2015-11-16 HISTORY — DX: Other specified postprocedural states: Z98.890

## 2015-11-16 HISTORY — DX: Other complications of anesthesia, initial encounter: T88.59XA

## 2015-11-16 HISTORY — DX: Nausea with vomiting, unspecified: R11.2

## 2015-11-16 HISTORY — PX: LAPAROSCOPIC APPENDECTOMY: SHX408

## 2015-11-16 HISTORY — DX: Adverse effect of unspecified anesthetic, initial encounter: T41.45XA

## 2015-11-16 LAB — COMPREHENSIVE METABOLIC PANEL
ALK PHOS: 85 U/L (ref 38–126)
ALT: 29 U/L (ref 17–63)
ANION GAP: 8 (ref 5–15)
AST: 25 U/L (ref 15–41)
Albumin: 4.5 g/dL (ref 3.5–5.0)
BILIRUBIN TOTAL: 0.5 mg/dL (ref 0.3–1.2)
BUN: 25 mg/dL — ABNORMAL HIGH (ref 6–20)
CALCIUM: 10.1 mg/dL (ref 8.9–10.3)
CO2: 24 mmol/L (ref 22–32)
Chloride: 105 mmol/L (ref 101–111)
Creatinine, Ser: 1.15 mg/dL (ref 0.61–1.24)
GFR calc non Af Amer: >60 mL/min (ref >60)
Glucose, Bld: 111 mg/dL — ABNORMAL HIGH (ref 65–99)
Potassium: 4 mmol/L (ref 3.5–5.1)
SODIUM: 137 mmol/L (ref 135–145)
TOTAL PROTEIN: 7.7 g/dL (ref 6.5–8.1)

## 2015-11-16 LAB — URINALYSIS, ROUTINE W REFLEX MICROSCOPIC
Bilirubin Urine: NEGATIVE
Glucose, UA: NEGATIVE mg/dL
Ketones, ur: NEGATIVE mg/dL
LEUKOCYTES UA: NEGATIVE
NITRITE: NEGATIVE
Protein, ur: NEGATIVE mg/dL
SPECIFIC GRAVITY, URINE: 1.01 (ref 1.005–1.030)
pH: 6 (ref 5.0–8.0)

## 2015-11-16 LAB — URINE MICROSCOPIC-ADD ON

## 2015-11-16 LAB — DIFFERENTIAL
BASOS ABS: 0 10*3/uL (ref 0.0–0.1)
Basophils Relative: 0 %
EOS ABS: 0.1 10*3/uL (ref 0.0–0.7)
Eosinophils Relative: 1 %
LYMPHS ABS: 2.2 10*3/uL (ref 0.7–4.0)
LYMPHS PCT: 11 %
Monocytes Absolute: 1.9 10*3/uL — ABNORMAL HIGH (ref 0.1–1.0)
Monocytes Relative: 10 %
NEUTROS PCT: 78 %
Neutro Abs: 15.4 10*3/uL — ABNORMAL HIGH (ref 1.7–7.7)

## 2015-11-16 LAB — CBC
HCT: 46.7 % (ref 39.0–52.0)
HEMOGLOBIN: 16.2 g/dL (ref 13.0–17.0)
MCH: 32.9 pg (ref 26.0–34.0)
MCHC: 34.7 g/dL (ref 30.0–36.0)
MCV: 94.9 fL (ref 78.0–100.0)
Platelets: 246 10*3/uL (ref 150–400)
RBC: 4.92 MIL/uL (ref 4.22–5.81)
RDW: 14.4 % (ref 11.5–15.5)
WBC: 19.7 10*3/uL — ABNORMAL HIGH (ref 4.0–10.5)

## 2015-11-16 LAB — LIPASE, BLOOD: Lipase: 29 U/L (ref 11–51)

## 2015-11-16 LAB — SURGICAL PCR SCREEN
MRSA, PCR: NEGATIVE
STAPHYLOCOCCUS AUREUS: NEGATIVE

## 2015-11-16 SURGERY — APPENDECTOMY, LAPAROSCOPIC
Anesthesia: General | Site: Abdomen

## 2015-11-16 MED ORDER — POVIDONE-IODINE 10 % OINT PACKET
TOPICAL_OINTMENT | CUTANEOUS | Status: DC | PRN
  Administered 2015-11-16: 1 via TOPICAL

## 2015-11-16 MED ORDER — ONDANSETRON HCL 4 MG/2ML IJ SOLN
INTRAMUSCULAR | Status: AC
  Filled 2015-11-16: qty 2

## 2015-11-16 MED ORDER — POVIDONE-IODINE 10 % EX OINT
TOPICAL_OINTMENT | CUTANEOUS | Status: AC
  Filled 2015-11-16: qty 1

## 2015-11-16 MED ORDER — GLYCOPYRROLATE 0.2 MG/ML IJ SOLN
INTRAMUSCULAR | Status: DC | PRN
  Administered 2015-11-16: 0.6 mg via INTRAVENOUS

## 2015-11-16 MED ORDER — ACETAMINOPHEN 650 MG RE SUPP: 650.0000 mg | Freq: Four times a day (QID) | RECTAL | Status: DC | PRN

## 2015-11-16 MED ORDER — BUPIVACAINE HCL (PF) 0.5 % IJ SOLN
INTRAMUSCULAR | Status: DC | PRN
  Administered 2015-11-16: 10 mL

## 2015-11-16 MED ORDER — LACTATED RINGERS IV SOLN
INTRAVENOUS | Status: DC | PRN
  Administered 2015-11-16 (×2): via INTRAVENOUS

## 2015-11-16 MED ORDER — CHLORHEXIDINE GLUCONATE 4 % EX LIQD: 1.0000 "application " | Freq: Once | CUTANEOUS | Status: DC

## 2015-11-16 MED ORDER — ALBUTEROL SULFATE (2.5 MG/3ML) 0.083% IN NEBU: 2.5000 mg | INHALATION_SOLUTION | RESPIRATORY_TRACT | Status: DC | PRN

## 2015-11-16 MED ORDER — PROPOFOL 10 MG/ML IV BOLUS
INTRAVENOUS | Status: AC
  Filled 2015-11-16: qty 20

## 2015-11-16 MED ORDER — MIDAZOLAM HCL 2 MG/2ML IJ SOLN
INTRAMUSCULAR | Status: AC
  Filled 2015-11-16: qty 2

## 2015-11-16 MED ORDER — ENOXAPARIN SODIUM 40 MG/0.4ML ~~LOC~~ SOLN: 40.0000 mg | SUBCUTANEOUS | Status: DC

## 2015-11-16 MED ORDER — NICOTINE 21 MG/24HR TD PT24
21.0000 mg | MEDICATED_PATCH | TRANSDERMAL | Status: DC
  Administered 2015-11-16 – 2015-11-17 (×2): 21 mg via TRANSDERMAL
  Filled 2015-11-16 (×2): qty 1

## 2015-11-16 MED ORDER — OXYCODONE-ACETAMINOPHEN 5-325 MG PO TABS: 1.0000 | ORAL_TABLET | ORAL | Status: DC | PRN

## 2015-11-16 MED ORDER — MIDAZOLAM HCL 2 MG/2ML IJ SOLN
INTRAMUSCULAR | Status: DC | PRN
  Administered 2015-11-16: 2 mg via INTRAVENOUS

## 2015-11-16 MED ORDER — ONDANSETRON 4 MG PO TBDP: 4.0000 mg | ORAL_TABLET | Freq: Four times a day (QID) | ORAL | Status: DC | PRN

## 2015-11-16 MED ORDER — SODIUM CHLORIDE 0.9 % IV SOLN: INTRAVENOUS | Status: DC

## 2015-11-16 MED ORDER — SODIUM CHLORIDE 0.9 % IV SOLN
INTRAVENOUS | Status: AC
  Administered 2015-11-16 (×2): via INTRAVENOUS

## 2015-11-16 MED ORDER — MORPHINE SULFATE (PF) 4 MG/ML IV SOLN
4.0000 mg | Freq: Once | INTRAVENOUS | Status: AC
  Administered 2015-11-16: 4 mg via INTRAVENOUS
  Filled 2015-11-16: qty 1

## 2015-11-16 MED ORDER — NEOSTIGMINE METHYLSULFATE 10 MG/10ML IV SOLN
INTRAVENOUS | Status: DC | PRN
  Administered 2015-11-16: 3 mg via INTRAVENOUS

## 2015-11-16 MED ORDER — BUPIVACAINE HCL (PF) 0.5 % IJ SOLN
INTRAMUSCULAR | Status: AC
  Filled 2015-11-16: qty 30

## 2015-11-16 MED ORDER — PIPERACILLIN-TAZOBACTAM 3.375 G IVPB
3.3750 g | Freq: Three times a day (TID) | INTRAVENOUS | Status: DC
  Administered 2015-11-16 – 2015-11-17 (×4): 3.375 g via INTRAVENOUS
  Filled 2015-11-16 (×5): qty 50

## 2015-11-16 MED ORDER — 0.9 % SODIUM CHLORIDE (POUR BTL) OPTIME
TOPICAL | Status: DC | PRN
  Administered 2015-11-16: 1000 mL

## 2015-11-16 MED ORDER — ONDANSETRON HCL 4 MG/2ML IJ SOLN: 4.0000 mg | Freq: Four times a day (QID) | INTRAMUSCULAR | Status: DC | PRN

## 2015-11-16 MED ORDER — SEVOFLURANE IN SOLN
RESPIRATORY_TRACT | Status: AC
  Filled 2015-11-16: qty 250

## 2015-11-16 MED ORDER — SIMETHICONE 80 MG PO CHEW: 40.0000 mg | CHEWABLE_TABLET | Freq: Four times a day (QID) | ORAL | Status: DC | PRN

## 2015-11-16 MED ORDER — ONDANSETRON HCL 4 MG/2ML IJ SOLN
4.0000 mg | Freq: Once | INTRAMUSCULAR | Status: AC
  Administered 2015-11-16: 4 mg via INTRAVENOUS
  Filled 2015-11-16: qty 2

## 2015-11-16 MED ORDER — SUCCINYLCHOLINE CHLORIDE 20 MG/ML IJ SOLN
INTRAMUSCULAR | Status: DC | PRN
  Administered 2015-11-16: 140 mg via INTRAVENOUS

## 2015-11-16 MED ORDER — FENTANYL CITRATE (PF) 100 MCG/2ML IJ SOLN
INTRAMUSCULAR | Status: DC | PRN
Start: 2015-11-16 — End: 2015-11-16
  Administered 2015-11-16 (×2): 25 ug via INTRAVENOUS
  Administered 2015-11-16 (×2): 50 ug via INTRAVENOUS
  Administered 2015-11-16: 25 ug via INTRAVENOUS
  Administered 2015-11-16: 50 ug via INTRAVENOUS
  Administered 2015-11-16: 25 ug via INTRAVENOUS

## 2015-11-16 MED ORDER — SODIUM CHLORIDE 0.9 % IR SOLN
Status: DC | PRN
  Administered 2015-11-16: 3000 mL

## 2015-11-16 MED ORDER — ROCURONIUM BROMIDE 100 MG/10ML IV SOLN
INTRAVENOUS | Status: DC | PRN
  Administered 2015-11-16: 25 mg via INTRAVENOUS
  Administered 2015-11-16: 5 mg via INTRAVENOUS

## 2015-11-16 MED ORDER — LIDOCAINE HCL (CARDIAC) 10 MG/ML IV SOLN
INTRAVENOUS | Status: DC | PRN
  Administered 2015-11-16: 50 mg via INTRAVENOUS

## 2015-11-16 MED ORDER — KETOROLAC TROMETHAMINE 30 MG/ML IJ SOLN
30.0000 mg | Freq: Once | INTRAMUSCULAR | Status: AC
  Administered 2015-11-16: 30 mg via INTRAVENOUS
  Filled 2015-11-16: qty 1

## 2015-11-16 MED ORDER — ONDANSETRON HCL 4 MG/2ML IJ SOLN: 4.0000 mg | Freq: Three times a day (TID) | INTRAMUSCULAR | Status: AC | PRN

## 2015-11-16 MED ORDER — HYDROMORPHONE HCL 1 MG/ML IJ SOLN
1.0000 mg | INTRAMUSCULAR | Status: DC | PRN
  Administered 2015-11-16: 1 mg via INTRAVENOUS
  Filled 2015-11-16 (×2): qty 1

## 2015-11-16 MED ORDER — ONDANSETRON HCL 4 MG/2ML IJ SOLN
INTRAMUSCULAR | Status: DC | PRN
  Administered 2015-11-16: 4 mg via INTRAVENOUS

## 2015-11-16 MED ORDER — DIPHENHYDRAMINE HCL 12.5 MG/5ML PO ELIX: 12.5000 mg | ORAL_SOLUTION | Freq: Four times a day (QID) | ORAL | Status: DC | PRN

## 2015-11-16 MED ORDER — GLYCOPYRROLATE 0.2 MG/ML IJ SOLN
INTRAMUSCULAR | Status: AC
  Filled 2015-11-16: qty 3

## 2015-11-16 MED ORDER — NICOTINE 21 MG/24HR TD PT24: 21.0000 mg | MEDICATED_PATCH | Freq: Every day | TRANSDERMAL | Status: DC

## 2015-11-16 MED ORDER — PROPOFOL 10 MG/ML IV BOLUS
INTRAVENOUS | Status: DC | PRN
  Administered 2015-11-16: 30 mg via INTRAVENOUS
  Administered 2015-11-16: 150 mg via INTRAVENOUS

## 2015-11-16 MED ORDER — PIPERACILLIN-TAZOBACTAM 3.375 G IVPB
3.3750 g | Freq: Once | INTRAVENOUS | Status: DC
Start: 2015-11-16 — End: 2015-11-16
  Filled 2015-11-16: qty 50

## 2015-11-16 MED ORDER — IOHEXOL 300 MG/ML  SOLN
100.0000 mL | Freq: Once | INTRAMUSCULAR | Status: AC | PRN
  Administered 2015-11-16: 100 mL via INTRAVENOUS

## 2015-11-16 MED ORDER — FENTANYL CITRATE (PF) 250 MCG/5ML IJ SOLN
INTRAMUSCULAR | Status: AC
  Filled 2015-11-16: qty 5

## 2015-11-16 MED ORDER — LACTATED RINGERS IV SOLN
INTRAVENOUS | Status: DC
  Administered 2015-11-16: 20:00:00 via INTRAVENOUS

## 2015-11-16 MED ORDER — DIPHENHYDRAMINE HCL 50 MG/ML IJ SOLN: 12.5000 mg | Freq: Four times a day (QID) | INTRAMUSCULAR | Status: DC | PRN

## 2015-11-16 MED ORDER — SODIUM CHLORIDE 0.9 % IV BOLUS (SEPSIS)
1000.0000 mL | Freq: Once | INTRAVENOUS | Status: AC
  Administered 2015-11-16: 1000 mL via INTRAVENOUS

## 2015-11-16 MED ORDER — ACETAMINOPHEN 325 MG PO TABS: 650.0000 mg | ORAL_TABLET | Freq: Four times a day (QID) | ORAL | Status: DC | PRN

## 2015-11-16 MED ORDER — HYDROMORPHONE HCL 1 MG/ML IJ SOLN
1.0000 mg | INTRAMUSCULAR | Status: DC | PRN
  Administered 2015-11-16 (×2): 1 mg via INTRAVENOUS
  Filled 2015-11-16 (×2): qty 1

## 2015-11-16 SURGICAL SUPPLY — 48 items
BAG HAMPER (MISCELLANEOUS) ×3 IMPLANT
CHLORAPREP W/TINT 26ML (MISCELLANEOUS) ×3 IMPLANT
CLOTH BEACON ORANGE TIMEOUT ST (SAFETY) ×3 IMPLANT
COVER LIGHT HANDLE STERIS (MISCELLANEOUS) ×6 IMPLANT
CUTTER FLEX LINEAR 45M (STAPLE) IMPLANT
CUTTER LINEAR ENDO 35 ART FLEX (STAPLE) ×3 IMPLANT
CUTTER LINEAR ENDO 35 ART THIN (STAPLE) IMPLANT
CUTTER LINEAR ENDO 35 ETS TH (STAPLE) IMPLANT
DECANTER SPIKE VIAL GLASS SM (MISCELLANEOUS) ×3 IMPLANT
DISSECTOR BLUNT TIP ENDO 5MM (MISCELLANEOUS) IMPLANT
ELECT REM PT RETURN 9FT ADLT (ELECTROSURGICAL) ×3
ELECTRODE REM PT RTRN 9FT ADLT (ELECTROSURGICAL) ×1 IMPLANT
EVACUATOR SMOKE 8.L (FILTER) ×3 IMPLANT
FORMALIN 10 PREFIL 120ML (MISCELLANEOUS) ×3 IMPLANT
GLOVE BIOGEL PI IND STRL 7.0 (GLOVE) ×3 IMPLANT
GLOVE BIOGEL PI INDICATOR 7.0 (GLOVE) ×6
GLOVE ECLIPSE 6.5 STRL STRAW (GLOVE) ×3 IMPLANT
GLOVE SURG SS PI 7.5 STRL IVOR (GLOVE) ×6 IMPLANT
GOWN STRL REUS W/ TWL XL LVL3 (GOWN DISPOSABLE) ×1 IMPLANT
GOWN STRL REUS W/TWL LRG LVL3 (GOWN DISPOSABLE) ×3 IMPLANT
GOWN STRL REUS W/TWL XL LVL3 (GOWN DISPOSABLE) ×2
INST SET LAPROSCOPIC AP (KITS) ×3 IMPLANT
IV NS IRRIG 3000ML ARTHROMATIC (IV SOLUTION) ×3 IMPLANT
KIT ROOM TURNOVER APOR (KITS) ×3 IMPLANT
MANIFOLD NEPTUNE II (INSTRUMENTS) ×3 IMPLANT
NEEDLE INSUFFLATION 14GA 120MM (NEEDLE) ×3 IMPLANT
NS IRRIG 1000ML POUR BTL (IV SOLUTION) ×3 IMPLANT
PACK LAP CHOLE LZT030E (CUSTOM PROCEDURE TRAY) ×3 IMPLANT
PAD ARMBOARD 7.5X6 YLW CONV (MISCELLANEOUS) ×3 IMPLANT
POUCH SPECIMEN RETRIEVAL 10MM (ENDOMECHANICALS) ×3 IMPLANT
RELOAD /EVU35 (ENDOMECHANICALS) IMPLANT
RELOAD 45 VASCULAR/THIN (ENDOMECHANICALS) IMPLANT
RELOAD CUTTER ETS 35MM STAND (ENDOMECHANICALS) IMPLANT
SET BASIN LINEN APH (SET/KITS/TRAYS/PACK) ×3 IMPLANT
SET TUBE IRRIG SUCTION NO TIP (IRRIGATION / IRRIGATOR) ×3 IMPLANT
SHEARS HARMONIC ACE PLUS 36CM (ENDOMECHANICALS) ×3 IMPLANT
SPONGE GAUZE 2X2 8PLY STER LF (GAUZE/BANDAGES/DRESSINGS) ×1
SPONGE GAUZE 2X2 8PLY STRL LF (GAUZE/BANDAGES/DRESSINGS) ×2 IMPLANT
STAPLER VISISTAT (STAPLE) ×3 IMPLANT
SUT VICRYL 0 UR6 27IN ABS (SUTURE) ×3 IMPLANT
TAPE CLOTH SURG 4X10 WHT LF (GAUZE/BANDAGES/DRESSINGS) ×3 IMPLANT
TRAY FOLEY CATH SILVER 16FR (SET/KITS/TRAYS/PACK) ×3 IMPLANT
TROCAR ENDO BLADELESS 11MM (ENDOMECHANICALS) ×3 IMPLANT
TROCAR ENDO BLADELESS 12MM (ENDOMECHANICALS) ×3 IMPLANT
TROCAR XCEL NON-BLD 5MMX100MML (ENDOMECHANICALS) ×3 IMPLANT
TUBING INSUFFLATION (TUBING) ×3 IMPLANT
WARMER LAPAROSCOPE (MISCELLANEOUS) ×3 IMPLANT
YANKAUER SUCT 12FT TUBE ARGYLE (SUCTIONS) ×3 IMPLANT

## 2015-11-16 NOTE — Anesthesia Postprocedure Evaluation (Signed)
Anesthesia Post Note  Patient: Kenneth MatarRoger Maddox  Procedure(s) Performed: Procedure(s) (LRB): APPENDECTOMY LAPAROSCOPIC (N/A)  Patient location during evaluation: PACU Anesthesia Type: General Level of consciousness: awake and alert Pain management: satisfactory to patient Vital Signs Assessment: post-procedure vital signs reviewed and stable Respiratory status: spontaneous breathing Cardiovascular status: blood pressure returned to baseline and stable Anesthetic complications: no    Last Vitals:  Filed Vitals:   11/16/15 1730 11/16/15 1745  BP: 112/68 107/67  Pulse: 75 83  Temp:    Resp: 13 15    Last Pain:  Filed Vitals:   11/16/15 1747  PainSc: Asleep                 Idella Lamontagne

## 2015-11-16 NOTE — ED Notes (Signed)
Pt c/o severe lower rt abd pain since 1700 with nausea.

## 2015-11-16 NOTE — Op Note (Signed)
Patient:  Kenneth MatarRoger Baquero  DOB:  [birth year]  MRN:  [id number]   Preop Diagnosis:  Acute appendicitis  Postop Diagnosis:  Same  Procedure:  Laparoscopic appendectomy  Surgeon:  Franky MachoMark Laurena Valko, M.D.  Anes:  Gen. endotracheal  Indications:  Patient is a 54 year old white male who presents with a less than 24-hour history of worsening right lower quadrant abdominal pain. CT scan of the abdomen revealed early acute appendicitis. The risks and benefits of the procedure including bleeding, infection, and the possibility of an open procedure were fully explained to the patient, who gave informed consent.  Procedure note:  The patient was placed the supine position. After induction of general endotracheal anesthesia, the abdomen was prepped and draped using the usual sterile technique with DuraPrep. Surgical site confirmation was performed.  A supraumbilical incision was made down to the fascia. A Veress needle was introduced into the abdominal cavity and confirmation of placement was done using the saline drop test. The abdomen was insufflated to 16 mmHg pressure. An 11 mm trocar was introduced into the abdominal cavity under direct visualization without difficulty. The patient was placed in deeper Trendelenburg position and additional 12 mm trocar was placed the suprapubic region and a 5 mm trocar was placed left lower quadrant region. The appendix was visualized and noted to be acutely inflamed. The mesoappendix was divided using the harmonic scalpel. During the dissection, a small perforation of the midportion of the appendix occurred and there was some stool spillage. This was immediately contained and evacuated without difficulty. A standard Endo GIA was placed across the base the appendix and fired. The appendix was then removed using an Endo Catch bag without difficulty. The staple line was inspected and noted within normal limits. The right lower quadrant was copiously irrigated normal saline until  the fluid was clear. All fluid and air were then evacuated from the abdominal cavity prior to removal of the trochars.  All wounds were irrigated with normal saline. All wounds were injected with 0.5% Sensorcaine. The supraumbilical fascia as well as suprapubic fascia were reapproximated using 0 Vicryl interrupted sutures. All skin incisions were closed using staples. Betadine ointment and dry sterile dressings were applied.  All tape and needle counts were correct the end of the procedure. Patient was extubated in the operating room and transferred to PACU in stable condition.  Complications:  None  EBL:  Minimal  Specimen:  Appendix

## 2015-11-16 NOTE — ED Provider Notes (Signed)
TIME SEEN: 2:50 AM  CHIEF COMPLAINT:  Right lower quadrant pain  HPI: Pt is a 54 y.o.  Male with history of CAD, hypertension, hyperlipidemia, COPD who presents emergency department with complaints of right lower quadrant pain that started yesterday afternoon and progressively worsened. He has had associated nausea but no vomiting or diarrhea. No fever or chills. No prior abdominal surgery. Denies chest pain or shortness of breath. Not on anticoagulation or antiplatelet agents. States he does not take any medication at all.  ROS: See HPI Constitutional: no fever  Eyes: no drainage  ENT: no runny nose   Cardiovascular:  no chest pain  Resp: no SOB  GI: no vomiting GU: no dysuria Integumentary: no rash  Allergy: no hives  Musculoskeletal: no leg swelling  Neurological: no slurred speech ROS otherwise negative  PAST MEDICAL HISTORY/PAST SURGICAL HISTORY:  Past Medical History  Diagnosis Date  . Coronary artery disease   . Myocardial infarction (HCC)   . Thyroid disease   . Hyperlipidemia LDL goal < 70   . Presence of stent in left circumflex coronary artery 06/2010    Ion EES 3.0 mm x 16 mm; proximal left circumflex  . Hypertension   . COPD (chronic obstructive pulmonary disease) (HCC)     MEDICATIONS:  Prior to Admission medications   Medication Sig Start Date End Date Taking? Authorizing Provider  cephALEXin (KEFLEX) 500 MG capsule Take 500 mg by mouth 3 (three) times daily.    Historical Provider, MD  cyclobenzaprine (FLEXERIL) 10 MG tablet Take 1 tablet (10 mg total) by mouth 2 (two) times daily as needed for muscle spasms. 05/08/14   Donnetta Hutching, MD  ibuprofen (ADVIL,MOTRIN) 200 MG tablet Take 400 mg by mouth every 6 (six) hours as needed for mild pain.    Historical Provider, MD  naproxen (NAPROSYN) 500 MG tablet Take 1 tablet (500 mg total) by mouth 2 (two) times daily. 05/08/14   Donnetta Hutching, MD    ALLERGIES:  No Known Allergies  SOCIAL HISTORY:  Social History   Substance Use Topics  . Smoking status: Current Every Day Smoker  . Smokeless tobacco: Not on file  . Alcohol Use: No    FAMILY HISTORY: History reviewed. No pertinent family history.  EXAM: BP 142/86 mmHg  Pulse 98  Temp(Src) 98 F (36.7 C)  Resp 18  Ht  (1.93 m)  Wt 200 lb (90.719 kg)  BMI 24.35 kg/m2  SpO2 97% CONSTITUTIONAL: Alert and oriented and responds appropriately to questions. Well-appearing; well-nourished HEAD: Normocephalic EYES: Conjunctivae clear, PERRL ENT: normal nose; no rhinorrhea; moist mucous membranes; pharynx without lesions noted NECK: Supple, no meningismus, no LAD  CARD: RRR; S1 and S2 appreciated; no murmurs, no clicks, no rubs, no gallops RESP: Normal chest excursion without splinting or tachypnea; breath sounds clear and equal bilaterally; no wheezes, no rhonchi, no rales, no hypoxia or respiratory distress, speaking full sentences ABD/GI: Normal bowel sounds; non-distended; soft,  Tender in the right lower quadrant, no rebound, no guarding, no peritoneal signs GU:  Normal external genitalia, circumcised male, normal penile shaft, no blood or discharge at the urethral meatus, no testicular masses or tenderness on exam, no scrotal masses or swelling, no hernias appreciated, 2+ femoral pulses bilaterally; no perineal erythema, warmth, subcutaneous air or crepitus; no high riding testicle, normal bilateral cremasteric reflex BACK:  The back appears normal and is non-tender to palpation, there is no CVA tenderness EXT: Normal ROM in all joints; non-tender to palpation; no edema; normal  capillary refill; no cyanosis, no calf tenderness or swelling    SKIN: Normal color for age and race; warm NEURO: Moves all extremities equally, sensation to light touch intact diffusely, cranial nerves II through XII intact PSYCH: The patient's mood and manner are appropriate. Grooming and personal hygiene are appropriate.  MEDICAL DECISION MAKING:  Patient here with  right lower quadrant pain. Concern for appendicitis. We'll obtain labs, urine. We'll give IV fluids, pain and nausea medicine. We'll obtain a CT of his abdomen and pelvis. Patient initially very hypertensive which I suspect was secondary to pain but this has improved. He appears nontoxic on exam.  ED PROGRESS:  Labs show leukocytosis of 19.7 with left shift. Otherwise labs are unremarkable. CT pending.    CT shows acute early appendicitis without perforation or abscess. We'll discuss with surgery on call. We'll give IV Zosyn.  Will keep pt NPO.   4:00 AM  Spoke with Dr. Lovell SheehanJenkins with general surgery. He agrees on admission. He is requesting a place holding orders. Patient updated with this plan.    EKG Interpretation  Date/Time:  Thursday November 16 2015 04:16:34 EST Ventricular Rate:  88 PR Interval:  178 QRS Duration: 91 QT Interval:  353 QTC Calculation: 427 R Axis:   33 Text Interpretation:  Sinus rhythm No significant change since last tracing since 2013 Confirmed by Carynn Felling,  DO, Bereket Gernert [phone]) on 11/16/2015 4:22:40 AM        Layla MawKristen N Donnica Jarnagin, DO 11/16/15 [id number]

## 2015-11-16 NOTE — Anesthesia Preprocedure Evaluation (Signed)
Anesthesia Evaluation  Patient identified by MRN, date of birth, ID band Patient awake    Reviewed: Allergy & Precautions, NPO status , Patient's Chart, lab work & pertinent test results  History of Anesthesia Complications (+) PONV  Airway Mallampati: III  TM Distance: >3 FB Neck ROM: Full    Dental  (+) Dental Advisory Given, Poor Dentition   Pulmonary COPD, Current Smoker,    breath sounds clear to auscultation       Cardiovascular hypertension, + CAD and + Past MI   Rhythm:Regular     Neuro/Psych    GI/Hepatic GERD  Controlled,Patient received Oral Contrast Agents,Occasional Reflux Tums for relief   Endo/Other    Renal/GU      Musculoskeletal   Abdominal   Peds  Hematology   Anesthesia Other Findings   Reproductive/Obstetrics                             Anesthesia Physical Anesthesia Plan  ASA: III  Anesthesia Plan: General   Post-op Pain Management:    Induction: Intravenous, Rapid sequence and Cricoid pressure planned  Airway Management Planned: Oral ETT  Additional Equipment:   Intra-op Plan:   Post-operative Plan: Extubation in OR  Informed Consent: I have reviewed the patients History and Physical, chart, labs and discussed the procedure including the risks, benefits and alternatives for the proposed anesthesia with the patient or authorized representative who has indicated his/her understanding and acceptance.   Dental advisory given  Plan Discussed with: Surgeon  Anesthesia Plan Comments:         Anesthesia Quick Evaluation

## 2015-11-16 NOTE — ED Notes (Signed)
Patient c/o RLQ pain that has increased in severity since 1700 yesterday. Patient states pain is worse when pressure is applied

## 2015-11-16 NOTE — Transfer of Care (Signed)
Immediate Anesthesia Transfer of Care Note  Patient: Kenneth Maddox  Procedure(s) Performed: Procedure(s) (LRB): APPENDECTOMY LAPAROSCOPIC (N/A)  Patient Location: PACU  Anesthesia Type: General  Level of Consciousness: awake  Airway & Oxygen Therapy: Patient Spontanous Breathing and non-rebreather face mask  Post-op Assessment: Report given to PACU RN, Post -op Vital signs reviewed and stable and Patient moving all extremities  Post vital signs: Reviewed and stable  Complications: No apparent anesthesia complications

## 2015-11-16 NOTE — Anesthesia Procedure Notes (Signed)
Procedure Name: Intubation Date/Time: 11/16/2015 4:03 PM Performed by: Franco NonesYATES, Mariajose Mow S Pre-anesthesia Checklist: Patient identified, Patient being monitored, Timeout performed, Emergency Drugs available and Suction available Patient Re-evaluated:Patient Re-evaluated prior to inductionOxygen Delivery Method: Circle System Utilized Preoxygenation: Pre-oxygenation with 100% oxygen Intubation Type: IV induction Ventilation: Mask ventilation without difficulty Laryngoscope Size: Miller and 2 Grade View: Grade I Tube type: Oral Tube size: 7.0 mm Number of attempts: 1 Airway Equipment and Method: Stylet and Oral airway Placement Confirmation: ETT inserted through vocal cords under direct vision,  positive ETCO2 and breath sounds checked- equal and bilateral Secured at: 22 cm Tube secured with: Tape Dental Injury: Teeth and Oropharynx as per pre-operative assessment

## 2015-11-16 NOTE — Care Management Note (Signed)
Case Management Note  Patient Details  Name: Kenneth MatarRoger Maddox MRN: [id number] Date of Birth: [birth year]  Subjective/Objective:                  Pt is from home, lives with his wife. Pt admitted for appendicitis. Pt ind with ADL's prior to admission.   Action/Plan: Pt plans to return home with self care at DC. DC anticipated after appendectomy this afternoon. No CM needs.   Expected Discharge Date:       11/16/2015           Expected Discharge Plan:  Home/Self Care  In-House Referral:  NA  Discharge planning Services  CM Consult  Post Acute Care Choice:  NA Choice offered to:  NA  DME Arranged:    DME Agency:     HH Arranged:    HH Agency:     Status of Service:  Completed, signed off  Medicare Important Message Given:    Date Medicare IM Given:    Medicare IM give by:    Date Additional Medicare IM Given:    Additional Medicare Important Message give by:     If discussed at Long Length of Stay Meetings, dates discussed:    Additional Comments:  Malcolm MetroChildress, Tikesha Mort Demske, RN 11/16/2015, 2:07 PM

## 2015-11-16 NOTE — H&P (Signed)
Kenneth Maddox is an 54 y.o. male.   Chief Complaint: Right lower quadrant abdominal pain HPI: Patient is a 53 year old white male who presented the emergency room yesterday evening with right lower quadrant abdominal pain which had started in the early afternoon. CT scan of the abdomen reveals early acute appendicitis. The patient continues to have right more quadrant abdominal pain.  Past Medical History  Diagnosis Date  . Coronary artery disease   . Myocardial infarction ([place])   . Thyroid disease   . Hyperlipidemia LDL goal < 70   . Presence of stent in left circumflex coronary artery 06/2010    Ion EES 3.0 mm x 16 mm; proximal left circumflex  . Hypertension   . COPD (chronic obstructive pulmonary disease) [facility])     Past Surgical History  Procedure Laterality Date  . Coronary stent placement    . External ear surgery    . Left heart catheterization with coronary angiogram N/A 02/26/2012    Procedure: LEFT HEART CATHETERIZATION WITH CORONARY ANGIOGRAM;  Surgeon: Leonie Man, MD;  Location: [facility] CATH LAB;  Service: Cardiovascular;  Laterality: N/A;    History reviewed. No pertinent family history. Social History:  reports that he has been smoking.  He does not have any smokeless tobacco history on file. He reports that he does not drink alcohol or use illicit drugs.  Allergies: No Known Allergies  Medications Prior to Admission  Medication Sig Dispense Refill  . cephALEXin (KEFLEX) 500 MG capsule Take 500 mg by mouth 3 (three) times daily.    . cyclobenzaprine (FLEXERIL) 10 MG tablet Take 1 tablet (10 mg total) by mouth 2 (two) times daily as needed for muscle spasms. 20 tablet 0  . ibuprofen (ADVIL,MOTRIN) 200 MG tablet Take 400 mg by mouth every 6 (six) hours as needed for mild pain.    . naproxen (NAPROSYN) 500 MG tablet Take 1 tablet (500 mg total) by mouth 2 (two) times daily. 20 tablet 0    Results for orders placed or performed during the hospital encounter of 11/16/15  (from the past 48 hour(s))  Lipase, blood     Status: None   Collection Time: 11/16/15  2:10 AM  Result Value Ref Range   Lipase 29 11 - 51 U/L  Comprehensive metabolic panel     Status: Abnormal   Collection Time: 11/16/15  2:10 AM  Result Value Ref Range   Sodium 137 135 - 145 mmol/L   Potassium 4.0 3.5 - 5.1 mmol/L   Chloride 105 101 - 111 mmol/L   CO2 24 22 - 32 mmol/L   Glucose, Bld 111 (H) 65 - 99 mg/dL   BUN 25 (H) 6 - 20 mg/dL   Creatinine, Ser 1.15 0.61 - 1.24 mg/dL   Calcium 10.1 8.9 - 10.3 mg/dL   Total Protein 7.7 6.5 - 8.1 g/dL   Albumin 4.5 3.5 - 5.0 g/dL   AST 25 15 - 41 U/L   ALT 29 17 - 63 U/L   Alkaline Phosphatase 85 38 - 126 U/L   Total Bilirubin 0.5 0.3 - 1.2 mg/dL   GFR calc non Af Amer >60 >60 mL/min   GFR calc Af Amer >60 >60 mL/min    Comment: (NOTE) The eGFR has been calculated using the CKD EPI equation. This calculation has not been validated in all clinical situations. eGFR's persistently <60 mL/min signify possible Chronic Kidney Disease.    Anion gap 8 5 - 15  CBC     Status:  Abnormal   Collection Time: 11/16/15  2:10 AM  Result Value Ref Range   WBC 19.7 (H) 4.0 - 10.5 K/uL   RBC 4.92 4.22 - 5.81 MIL/uL   Hemoglobin 16.2 13.0 - 17.0 g/dL   HCT 46.7 39.0 - 52.0 %   MCV 94.9 78.0 - 100.0 fL   MCH 32.9 26.0 - 34.0 pg   MCHC 34.7 30.0 - 36.0 g/dL   RDW 14.4 11.5 - 15.5 %   Platelets 246 150 - 400 K/uL  Differential     Status: Abnormal   Collection Time: 11/16/15  2:10 AM  Result Value Ref Range   Neutrophils Relative % 78 %   Neutro Abs 15.4 (H) 1.7 - 7.7 K/uL   Lymphocytes Relative 11 %   Lymphs Abs 2.2 0.7 - 4.0 K/uL   Monocytes Relative 10 %   Monocytes Absolute 1.9 (H) 0.1 - 1.0 K/uL   Eosinophils Relative 1 %   Eosinophils Absolute 0.1 0.0 - 0.7 K/uL   Basophils Relative 0 %   Basophils Absolute 0.0 0.0 - 0.1 K/uL  Urinalysis, Routine w reflex microscopic (not at [facility])     Status: Abnormal   Collection Time: 11/16/15  4:17 AM   Result Value Ref Range   Color, Urine YELLOW YELLOW   APPearance CLEAR CLEAR   Specific Gravity, Urine 1.010 1.005 - 1.030   pH 6.0 5.0 - 8.0   Glucose, UA NEGATIVE NEGATIVE mg/dL   Hgb urine dipstick TRACE (A) NEGATIVE   Bilirubin Urine NEGATIVE NEGATIVE   Ketones, ur NEGATIVE NEGATIVE mg/dL   Protein, ur NEGATIVE NEGATIVE mg/dL   Nitrite NEGATIVE NEGATIVE   Leukocytes, UA NEGATIVE NEGATIVE  Urine microscopic-add on     Status: Abnormal   Collection Time: 11/16/15  4:17 AM  Result Value Ref Range   Squamous Epithelial / LPF 0-5 (A) NONE SEEN   WBC, UA 0-5 0 - 5 WBC/hpf   RBC / HPF 0-5 0 - 5 RBC/hpf   Bacteria, UA FEW (A) NONE SEEN  Surgical pcr screen     Status: None   Collection Time: 11/16/15  5:36 AM  Result Value Ref Range   MRSA, PCR NEGATIVE NEGATIVE   Staphylococcus aureus NEGATIVE NEGATIVE    Comment:        The Xpert SA Assay (FDA approved for NASAL specimens in patients over 75 years of age), is one component of a comprehensive surveillance program.  Test performance has been validated by [facility] for patients greater than or equal to 39 year old. It is not intended to diagnose infection nor to guide or monitor treatment.    Ct Abdomen Pelvis W Contrast  11/16/2015  CLINICAL DATA:  54 year old male with right lower quadrant abdominal pain EXAM: CT ABDOMEN AND PELVIS WITH CONTRAST TECHNIQUE: Multidetector CT imaging of the abdomen and pelvis was performed using the standard protocol following bolus administration of intravenous contrast. CONTRAST:  119m OMNIPAQUE IOHEXOL 300 MG/ML  SOLN COMPARISON:  None. FINDINGS: The visualized lung bases are clear. No intra-abdominal free air or free fluid. Diffuse hepatic steatosis. The gallbladder, pancreas, spleen, adrenal glands appear unremarkable. A 3 mm nonobstructing left renal inferior pole calculus. No hydronephrosis. The right kidney is unremarkable. The visualized ureters and urinary bladder appear unremarkable.  The prostate and seminal vesicles are grossly unremarkable. There is no evidence of bowel obstruction. A small appendicolith is noted at the base of the appendix. There is mild periappendiceal stranding concerning for an early acute appendicitis. Clinical  correlation is recommended. The appendix is located in the right hemipelvis inferior to the cecum. No drainable fluid collection/ abscess identified. Mild aortoiliac atherosclerotic disease. The abdominal aorta and IVC appear patent. There is focal ectasia of the infrarenal abdominal aorta measuring up tissue and lung 50 air indications transverse diameter. No portal venous gas identified. There is no lymphadenopathy. The abdominal wall soft tissues appear unremarkable. Degenerative changes of the spine. No acute fracture. IMPRESSION: Findings most compatible with an early acute appendicitis. Clinical correlation is recommended. No abscess. Small nonobstructing left renal inferior pole calculus. No hydronephrosis. Electronically Signed   By: Anner Crete M.D.   On: 11/16/2015 03:53    Review of Systems  Constitutional: Positive for malaise/fatigue.  HENT: Negative.   Eyes: Negative.   Respiratory: Positive for cough.   Cardiovascular: Negative.   Gastrointestinal: Positive for abdominal pain.  Genitourinary: Negative.   Musculoskeletal: Negative.   Skin: Negative.     Blood pressure 119/98, pulse 98, temperature 98.1 F (36.7 C), temperature source Oral, resp. rate 18, height _0  (1.93 m), weight 90.719 kg (200 lb), SpO2 96 %. Physical Exam  Constitutional: He is oriented to person, place, and time. He appears well-developed and well-nourished.  HENT:  Head: Normocephalic and atraumatic.  Neck: Normal range of motion. Neck supple.  Cardiovascular: Normal rate, regular rhythm and normal heart sounds.   Respiratory: Effort normal and breath sounds normal.  GI: Soft. He exhibits no distension. There is tenderness. There is no rebound.   Tender in the right lower quadrant to palpation. No rigidity noted.  Neurological: He is alert and oriented to person, place, and time.  Skin: Skin is warm and dry.     Assessment/Plan Impression: Acute appendicitis Plan: We'll proceed with laparoscopic appendectomy today. The risks and benefits of procedure including bleeding, infection, and the possibility of an open procedure were fully explained to the patient, who gave informed consent.  Conley Pawling A 11/16/2015, 9:10 AM

## 2015-11-17 ENCOUNTER — Encounter (HOSPITAL_COMMUNITY): Payer: Self-pay | Admitting: General Surgery

## 2015-11-17 LAB — BASIC METABOLIC PANEL
Anion gap: 7 (ref 5–15)
BUN: 21 mg/dL — ABNORMAL HIGH (ref 6–20)
CHLORIDE: 104 mmol/L (ref 101–111)
CO2: 23 mmol/L (ref 22–32)
CREATININE: 1.46 mg/dL — AB (ref 0.61–1.24)
Calcium: 8.3 mg/dL — ABNORMAL LOW (ref 8.9–10.3)
GFR calc non Af Amer: 54 mL/min — ABNORMAL LOW (ref >60)
Glucose, Bld: 103 mg/dL — ABNORMAL HIGH (ref 65–99)
Potassium: 3.6 mmol/L (ref 3.5–5.1)
SODIUM: 134 mmol/L — AB (ref 135–145)

## 2015-11-17 LAB — CBC
HCT: 40.7 % (ref 39.0–52.0)
HEMOGLOBIN: 13.4 g/dL (ref 13.0–17.0)
MCH: 32 pg (ref 26.0–34.0)
MCHC: 32.9 g/dL (ref 30.0–36.0)
MCV: 97.1 fL (ref 78.0–100.0)
Platelets: 192 10*3/uL (ref 150–400)
RBC: 4.19 MIL/uL — AB (ref 4.22–5.81)
RDW: 15 % (ref 11.5–15.5)
WBC: 12.4 10*3/uL — ABNORMAL HIGH (ref 4.0–10.5)

## 2015-11-17 MED ORDER — OXYCODONE-ACETAMINOPHEN 7.5-325 MG PO TABS: 1.0000 | ORAL_TABLET | ORAL | Status: DC | PRN

## 2015-11-17 MED ORDER — CIPROFLOXACIN HCL 500 MG PO TABS
500.0000 mg | ORAL_TABLET | Freq: Two times a day (BID) | ORAL | Status: DC
Start: 2015-11-17 — End: 2015-12-08

## 2015-11-17 NOTE — Discharge Summary (Signed)
Physician Discharge Summary  Patient ID: Kenneth MatarRoger Maddox MRN: [id number] DOB/AGE: [birth year] 54 y.o.  Admit date: 11/16/2015 Discharge date: 11/17/2015  Admission Diagnoses: Acute appendicitis  Discharge Diagnoses: Same Active Problems:   Appendicitis   Acute appendicitis  COPD  Discharged Condition: good  Hospital Course: Patient is a 54 year old white male who presented to the emergency room with worsening right lower quadrant abdominal pain. It had started less than 12 hours earlier. CT scan of the abdomen revealed early acute appendicitis. He was taken to the operating room on 11/16/2015 and underwent laparoscopic appendectomy. Tolerated the procedure well. His postoperative course was for the most part unremarkable. His diet was advanced without difficulty. The patient is being discharged home on 11/17/2015 in good and improving condition.  Treatments: surgery: Laparoscopic appendectomy on 11/16/2015  Discharge Exam: Blood pressure 106/73, pulse 103, temperature 100.4 F (38 C), temperature source Oral, resp. rate 20, height 6\' 4"  (1.93 m), weight 99.791 kg (220 lb), SpO2 91 %. General appearance: alert, cooperative and no distress Resp: clear to auscultation bilaterally Cardio: regular rate and rhythm, S1, S2 normal, no murmur, click, rub or gallop GI: Soft, dressings dry and intact.  Disposition: 01-Home or Self Care     Medication List    TAKE these medications        ciprofloxacin 500 MG tablet  Commonly known as:  CIPRO  Take 1 tablet (500 mg total) by mouth 2 (two) times daily.     oxyCODONE-acetaminophen 7.5-325 MG tablet  Commonly known as:  PERCOCET  Take 1-2 tablets by mouth every 4 (four) hours as needed.           Follow-up Information    Follow up with Dalia HeadingJENKINS,Severn Goddard A, MD. Schedule an appointment as soon as possible for a visit on 11/23/2015.   Specialty:  General Surgery   Contact information:   [address] [place]  [id number] [phone]       Signed: Franky MachoJENKINS,Kandon Hosking A 11/17/2015, 7:32 AM

## 2015-11-17 NOTE — Discharge Instructions (Signed)
Laparoscopic Appendectomy, Adult, Care After °Refer to this sheet in the next few weeks. These instructions provide you with information on caring for yourself after your procedure. Your caregiver may also give you more specific instructions. Your treatment has been planned according to current medical practices, but problems sometimes occur. Call your caregiver if you have any problems or questions after your procedure. °HOME CARE INSTRUCTIONS °· Do not drive while taking narcotic pain medicines. °· Use stool softener if you become constipated from your pain medicines. °· Change your bandages (dressings) as directed. °· Keep your wounds clean and dry. You may wash the wounds gently with soap and water. Gently pat the wounds dry with a clean towel. °· Do not take baths, swim, or use hot tubs for 10 days, or as instructed by your caregiver. °· Only take over-the-counter or prescription medicines for pain, discomfort, or fever as directed by your caregiver. °· You may continue your normal diet as directed. °· Do not lift more than 10 pounds (4.5 kg) or play contact sports for 3 weeks, or as directed. °· Slowly increase your activity after surgery. °· Take deep breaths to avoid getting a lung infection (pneumonia). °SEEK MEDICAL CARE IF: °· You have redness, swelling, or increasing pain in your wounds. °· You have pus coming from your wounds. °· You have drainage from a wound that lasts longer than 1 day. °· You notice a bad smell coming from the wounds or dressing. °· Your wound edges break open after stitches (sutures) have been removed. °· You notice increasing pain in the shoulders (shoulder strap areas) or near your shoulder blades. °· You develop dizzy episodes or fainting while standing. °· You develop shortness of breath. °· You develop persistent nausea or vomiting. °· You cannot control your bowel functions or lose your appetite. °· You develop diarrhea. °SEEK IMMEDIATE MEDICAL CARE IF:  °· You have a  fever. °· You develop a rash. °· You have difficulty breathing or sharp pains in your chest. °· You develop any reaction or side effects to medicines given. °MAKE SURE YOU: °· Understand these instructions. °· Will watch your condition. °· Will get help right away if you are not doing well or get worse. °  °This information is not intended to replace advice given to you by your health care provider. Make sure you discuss any questions you have with your health care provider. °  °Document Released: 11/25/2005 Document Revised: 04/11/2015 Document Reviewed: 05/15/2015 °Elsevier Interactive Patient Education ©2016 [employer] ° °

## 2015-12-08 ENCOUNTER — Emergency Department (HOSPITAL_COMMUNITY): Payer: Managed Care, Other (non HMO)

## 2015-12-08 ENCOUNTER — Encounter (HOSPITAL_COMMUNITY): Payer: Self-pay | Admitting: Emergency Medicine

## 2015-12-08 ENCOUNTER — Emergency Department (HOSPITAL_COMMUNITY)
Admission: EM | Admit: 2015-12-08 | Discharge: 2015-12-08 | Disposition: A | Discharge status: Home / Self Care | Payer: Managed Care, Other (non HMO) | Attending: Emergency Medicine | Admitting: Emergency Medicine

## 2015-12-08 DIAGNOSIS — I10 Essential (primary) hypertension: Secondary | ICD-10-CM | POA: Diagnosis not present

## 2015-12-08 DIAGNOSIS — Z792 Long term (current) use of antibiotics: Secondary | ICD-10-CM | POA: Diagnosis not present

## 2015-12-08 DIAGNOSIS — Z9889 Other specified postprocedural states: Secondary | ICD-10-CM | POA: Insufficient documentation

## 2015-12-08 DIAGNOSIS — R1031 Right lower quadrant pain: Secondary | ICD-10-CM | POA: Diagnosis not present

## 2015-12-08 DIAGNOSIS — I2510 Atherosclerotic heart disease of native coronary artery without angina pectoris: Secondary | ICD-10-CM | POA: Insufficient documentation

## 2015-12-08 DIAGNOSIS — E079 Disorder of thyroid, unspecified: Secondary | ICD-10-CM | POA: Insufficient documentation

## 2015-12-08 DIAGNOSIS — F17200 Nicotine dependence, unspecified, uncomplicated: Secondary | ICD-10-CM | POA: Diagnosis not present

## 2015-12-08 DIAGNOSIS — I252 Old myocardial infarction: Secondary | ICD-10-CM | POA: Insufficient documentation

## 2015-12-08 DIAGNOSIS — J449 Chronic obstructive pulmonary disease, unspecified: Secondary | ICD-10-CM | POA: Diagnosis not present

## 2015-12-08 DIAGNOSIS — Z955 Presence of coronary angioplasty implant and graft: Secondary | ICD-10-CM | POA: Insufficient documentation

## 2015-12-08 DIAGNOSIS — R109 Unspecified abdominal pain: Secondary | ICD-10-CM | POA: Diagnosis present

## 2015-12-08 LAB — COMPREHENSIVE METABOLIC PANEL
ALBUMIN: 3.8 g/dL (ref 3.5–5.0)
ALK PHOS: 86 U/L (ref 38–126)
ALT: 29 U/L (ref 17–63)
AST: 19 U/L (ref 15–41)
Anion gap: 9 (ref 5–15)
BILIRUBIN TOTAL: 1 mg/dL (ref 0.3–1.2)
BUN: 18 mg/dL (ref 6–20)
CALCIUM: 9.1 mg/dL (ref 8.9–10.3)
CO2: 23 mmol/L (ref 22–32)
CREATININE: 1.25 mg/dL — AB (ref 0.61–1.24)
Chloride: 103 mmol/L (ref 101–111)
GFR calc Af Amer: >60 mL/min (ref >60)
GFR calc non Af Amer: >60 mL/min (ref >60)
GLUCOSE: 111 mg/dL — AB (ref 65–99)
Potassium: 3.7 mmol/L (ref 3.5–5.1)
Sodium: 135 mmol/L (ref 135–145)
TOTAL PROTEIN: 7.4 g/dL (ref 6.5–8.1)

## 2015-12-08 LAB — URINALYSIS, ROUTINE W REFLEX MICROSCOPIC
BILIRUBIN URINE: NEGATIVE
Glucose, UA: NEGATIVE mg/dL
KETONES UR: NEGATIVE mg/dL
Leukocytes, UA: NEGATIVE
NITRITE: NEGATIVE
PH: 5.5 (ref 5.0–8.0)
Specific Gravity, Urine: 1.025 (ref 1.005–1.030)

## 2015-12-08 LAB — CBC
HEMATOCRIT: 43.9 % (ref 39.0–52.0)
Hemoglobin: 14.5 g/dL (ref 13.0–17.0)
MCH: 31.4 pg (ref 26.0–34.0)
MCHC: 33 g/dL (ref 30.0–36.0)
MCV: 95 fL (ref 78.0–100.0)
PLATELETS: 331 10*3/uL (ref 150–400)
RBC: 4.62 MIL/uL (ref 4.22–5.81)
RDW: 14.4 % (ref 11.5–15.5)
WBC: 18.2 10*3/uL — ABNORMAL HIGH (ref 4.0–10.5)

## 2015-12-08 LAB — URINE MICROSCOPIC-ADD ON
BACTERIA UA: NONE SEEN
SQUAMOUS EPITHELIAL / LPF: NONE SEEN
WBC, UA: NONE SEEN WBC/hpf (ref 0–5)

## 2015-12-08 LAB — LIPASE, BLOOD: Lipase: 16 U/L (ref 11–51)

## 2015-12-08 MED ORDER — METRONIDAZOLE 500 MG PO TABS
500.0000 mg | ORAL_TABLET | Freq: Once | ORAL | Status: AC
  Administered 2015-12-08: 500 mg via ORAL
  Filled 2015-12-08: qty 1

## 2015-12-08 MED ORDER — CIPROFLOXACIN HCL 250 MG PO TABS
500.0000 mg | ORAL_TABLET | Freq: Once | ORAL | Status: AC
  Administered 2015-12-08: 500 mg via ORAL
  Filled 2015-12-08: qty 2

## 2015-12-08 MED ORDER — CIPROFLOXACIN HCL 500 MG PO TABS: 500.0000 mg | ORAL_TABLET | Freq: Two times a day (BID) | ORAL | Status: DC

## 2015-12-08 MED ORDER — DIATRIZOATE MEGLUMINE & SODIUM 66-10 % PO SOLN
ORAL | Status: AC
  Filled 2015-12-08: qty 60

## 2015-12-08 MED ORDER — METRONIDAZOLE 250 MG PO TABS: 250.0000 mg | ORAL_TABLET | Freq: Three times a day (TID) | ORAL | Status: DC

## 2015-12-08 MED ORDER — IOHEXOL 300 MG/ML  SOLN
100.0000 mL | Freq: Once | INTRAMUSCULAR | Status: AC | PRN
  Administered 2015-12-08: 100 mL via INTRAVENOUS

## 2015-12-08 MED ORDER — CIPROFLOXACIN HCL 250 MG PO TABS
ORAL_TABLET | ORAL | Status: AC
  Filled 2015-12-08: qty 1

## 2015-12-08 NOTE — Discharge Instructions (Signed)
Prescription for two antibiotics.  See Dr Lovell SheehanJenkins next Thursday.  Return for fever, worsening pain

## 2015-12-08 NOTE — ED Notes (Signed)
MD at bedside. 

## 2015-12-08 NOTE — ED Notes (Signed)
Pt reports right sided pain,lightheadedness since yesterday.pt reports had appendectomy on dec 8th. Pt denies any fever,n/v/d. Pt reports had syncopal episode while sitting in a chair this am. Pt denies fall/hitting head. nad noted.

## 2015-12-08 NOTE — ED Provider Notes (Addendum)
CSN: [id number]     Arrival date & time 12/08/15  0702 History   First MD Initiated Contact with Patient 12/08/15 0719     Chief Complaint  Patient presents with  . Abdominal Pain     (Consider location/radiation/quality/duration/timing/severity/associated sxs/prior Treatment) HPI..... Right mid and lower abdominal pain for the past 24 hours with a sensation of feeling cold.  Status post appendectomy on December 8 by Dr. Lovell Sheehan. He has been doing well postoperatively. Good oral intake. No dysuria, vomiting, diarrhea, chest pain, dyspnea. Severity symptoms is moderate.  Past Medical History  Diagnosis Date  . Coronary artery disease   . Myocardial infarction ([facility])   . Thyroid disease   . Hyperlipidemia LDL goal < 70   . Presence of stent in left circumflex coronary artery 06/2010    Ion EES 3.0 mm x 16 mm; proximal left circumflex  . Hypertension   . COPD (chronic obstructive pulmonary disease) ([facility])   . Complication of anesthesia   . PONV (postoperative nausea and vomiting)    Past Surgical History  Procedure Laterality Date  . Coronary stent placement    . External ear surgery    . Left heart catheterization with coronary angiogram N/A 02/26/2012    Procedure: LEFT HEART CATHETERIZATION WITH CORONARY ANGIOGRAM;  Surgeon: Marykay Lex, MD;  Location: [facility] CATH LAB;  Service: Cardiovascular;  Laterality: N/A;  . Laparoscopic appendectomy N/A 11/16/2015    Procedure: APPENDECTOMY LAPAROSCOPIC;  Surgeon: Franky Macho, MD;  Location: [facility] ORS;  Service: General;  Laterality: N/A;   Family History  Problem Relation Age of Onset  . Leukemia Mother    Social History  Substance Use Topics  . Smoking status: Current Every Day Smoker -- 1.50 packs/day for 39 years  . Smokeless tobacco: Never Used  . Alcohol Use: No    Review of Systems  All other systems reviewed and are negative.     Allergies  Review of patient's allergies indicates no known allergies.  Home Medications    Prior to Admission medications   Medication Sig Start Date End Date Taking? Authorizing Provider  ciprofloxacin (CIPRO) 500 MG tablet Take 1 tablet (500 mg total) by mouth 2 (two) times daily. 12/08/15   Donnetta Hutching, MD  metroNIDAZOLE (FLAGYL) 250 MG tablet Take 1 tablet (250 mg total) by mouth 3 (three) times daily. 12/08/15   Donnetta Hutching, MD   BP 118/80 mmHg  Pulse 90  Temp(Src) 97.9 F (36.6 C) (Oral)  Resp 16  Ht  (1.93 m)  Wt 225 lb (102.059 kg)  BMI 27.40 kg/m2  SpO2 96% Physical Exam  Constitutional: He is oriented to person, place, and time. He appears well-developed and well-nourished.  HENT:  Head: Normocephalic and atraumatic.  Eyes: Conjunctivae and EOM are normal. Pupils are equal, round, and reactive to light.  Neck: Normal range of motion. Neck supple.  Cardiovascular: Normal rate and regular rhythm.   Pulmonary/Chest: Effort normal and breath sounds normal.  Abdominal:  Moderate tenderness in right lower abdomen  Musculoskeletal: Normal range of motion.  Neurological: He is alert and oriented to person, place, and time.  Skin: Skin is warm and dry.  Psychiatric: He has a normal mood and affect. His behavior is normal.  Nursing note and vitals reviewed.   ED Course  Procedures (including critical care time) Labs Review Labs Reviewed  COMPREHENSIVE METABOLIC PANEL - Abnormal; Notable for the following:    Glucose, Bld 111 (*)    Creatinine, Ser 1.25 (*)  All other components within normal limits  CBC - Abnormal; Notable for the following:    WBC 18.2 (*)    All other components within normal limits  URINALYSIS, ROUTINE W REFLEX MICROSCOPIC (NOT AT [facility]) - Abnormal; Notable for the following:    Hgb urine dipstick TRACE (*)    Protein, ur TRACE (*)    All other components within normal limits  LIPASE, BLOOD  URINE MICROSCOPIC-ADD ON    Imaging Review Ct Abdomen Pelvis W Contrast  12/08/2015  CLINICAL DATA:  Acute right-sided abdominal pain.  EXAM: CT ABDOMEN AND PELVIS WITH CONTRAST TECHNIQUE: Multidetector CT imaging of the abdomen and pelvis was performed using the standard protocol following bolus administration of intravenous contrast. CONTRAST:  100mL OMNIPAQUE IOHEXOL 300 MG/ML  SOLN COMPARISON:  CT scan of November 16, 2015. FINDINGS: Mild degenerative disc disease is noted at L5-S1. Visualized lung bases are unremarkable. No gallstones are noted. The liver, spleen and pancreas are unremarkable. Adrenal glands appear normal. Stable small nonobstructive calculus is noted in lower pole collecting system of left kidney. No hydronephrosis or renal obstruction is noted. No ureteral calculi are noted. Atherosclerosis of abdominal aorta is noted. No aneurysm is seen. The patient is status post appendectomy. Large amount of stranding is noted in right lower quadrant surrounding the cecum and terminal ileum with associated wall thickening, consistent with inflammation and other postoperative changes. Mild amount of free fluid is noted in this area as well which most likely is postoperative or inflammatory. There is no evidence of bowel obstruction. Urinary bladder is decompressed. Mild prostatic enlargement is noted. No significant adenopathy is noted no pneumoperitoneum is noted. IMPRESSION: Status post recent appendectomy. Large amount of stranding is noted in the right lower quadrant surrounding the cecum and terminal ileum, with associated wall thickening of these structures, which is concerning for combination of acute inflammation and other postoperative changes. Small amount of free fluid is seen in this area which most likely is inflammatory or postoperative in nature, but does not have well-defined margins or enhancing wall to suggest abscess at this time. Stable nonobstructive left renal calculus. No hydronephrosis or renal obstruction is seen. Electronically Signed   By: Lupita RaiderJames  Green Jr, M.D.   On: 12/08/2015 11:09   I have personally reviewed  and evaluated these images and lab results as part of my medical decision-making.   EKG Interpretation None      MDM   Final diagnoses:  Right lower quadrant abdominal pain    White count is elevated at 18.2 K.   Will obtain CT scan of abdomen and pelvis to rule out pathology.  1300:   Disc CT results with Dr. Franky MachoMark Jenkins. He recommended Cipro and Flagyl. He will recheck next Thursday. This was discussed with the patient. No acute abdomen at discharge. He understands to return if symptoms worsen including fever, chills, worsening pain.  Donnetta HutchingBrian Crystallee Werden, MD 12/08/15 [id number]  Donnetta HutchingBrian Bird Tailor, MD 12/08/15 [phone]

## 2015-12-08 NOTE — ED Notes (Signed)
Radiology at bedside. Pt given contrast to drink at this time. Wait time to CT is 90 minutes.

## 2015-12-08 NOTE — ED Notes (Signed)
Pt unable to give urine sample at this time 

## 2016-01-18 NOTE — Progress Notes (Signed)
Cardiology Office Note   Date:  01/19/2016   ID:  Kenneth Maddox, DOB 10-17-1961, MRN [id number]  PCP:  Kenneth Stalling, MD  Cardiologist:   Kenneth Hook, MD   Chief Complaint  Patient presents with  . New Evaluation    some chest pain, some shortness of breath, no swelling, no cramping, no dizziness or lightheadedness      History of Present Illness: Kenneth Maddox is a 55 y.o. male with CAD s/p MI (DES to LCx), hypertension, COPD, and hyperlipidemia who presents for an evaluation of chest discomfort.  He reports chest discomfort that occurs sporadically both at rest and at exertion.  He first noted it in November and it is getting progressively worse.  It occurs most frequently in the morning or late afternoon and less frequently when he is at work and active.  He describes the chest pain as pressure that iis 2-3/10 in severity and lasts from minutes to hours.   He notes some exertional dyspnea but doesn't necessarily feel more short of breath when the chest discomfort occurs.  He notes the shortness of breath when walking up an incline.  In the past he was advised to take aspirin but he has not been taking it due to easy bleeding.  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  Kenneth Maddox recently had an appendectomy that was complicated by infection post-operatively.  He had an MI in 2010 and reports that his current symptoms are similar to his MI.  He denies lower extremity edema, orthopnea or PND.    Kenneth Maddox aslo reports that his arms and legs go numb.  His arms get numb when he bends his arms and the leg symptoms occur when he elevates his legs at work.  Kenneth Maddox smokes 1 ppd x40 years.  He is not interested in quitting.  Past Medical History  Diagnosis Date  . Coronary artery disease   . Myocardial infarction (HCC)   . Thyroid disease   . Hyperlipidemia LDL goal < 70   . Presence of stent in left circumflex coronary artery 06/2010      Ion EES 3.0 mm x 16 mm; proximal left circumflex  . Hypertension   . COPD (chronic obstructive pulmonary disease) (HCC)   . Complication of anesthesia   . PONV (postoperative nausea and vomiting)     Past Surgical History  Procedure Laterality Date  . Coronary stent placement    . External ear surgery    . Left heart catheterization with coronary angiogram N/A 02/26/2012    Procedure: LEFT HEART CATHETERIZATION WITH CORONARY ANGIOGRAM;  Surgeon: Marykay Lex, MD;  Location: [facility] CATH LAB;  Service: Cardiovascular;  Laterality: N/A;  . Laparoscopic appendectomy N/A 11/16/2015    Procedure: APPENDECTOMY LAPAROSCOPIC;  Surgeon: Franky Macho, MD;  Location: [facility] ORS;  Service: General;  Laterality: N/A;  . Appendectomy       Current Outpatient Prescriptions  Medication Sig Dispense Refill  . aspirin EC 81 MG tablet Take 1 tablet (81 mg total) by mouth daily. 90 tablet 3  . nitroGLYCERIN (NITROSTAT) 0.4 MG SL tablet Place 1 tablet (0.4 mg total) under the tongue every 5 (five) minutes as needed for chest pain. 25 tablet 5   No current facility-administered medications for this visit.    Allergies:   Review of patient's allergies indicates no known allergies.    Social History:  The patient  reports that he has been smoking.  He has never used smokeless tobacco. He reports that he does not drink alcohol or use illicit drugs.   Family History:  The patient's family history includes Diabetes in his daughter; Leukemia in his mother.    ROS:  Please see the history of present illness.   Otherwise, review of systems are positive for none.   All other systems are reviewed and negative.    PHYSICAL EXAM: VS:  BP 130/90 mmHg  Pulse 71  Ht  (1.93 m)  Wt 96.616 kg (213 lb)  BMI 25.94 kg/m2 , BMI Body mass index is 25.94 kg/(m^2). GENERAL:  Well appearing HEENT:  Pupils equal round and reactive, fundi not visualized, oral mucosa unremarkable NECK:  No jugular venous distention,  waveform within normal limits, carotid upstroke brisk and symmetric, no bruits, no thyromegaly LYMPHATICS:  No cervical adenopathy LUNGS:  Clear to auscultation bilaterally HEART:  RRR.  PMI not displaced or sustained,S1 and S2 within normal limits, no S3, no S4, no clicks, no rubs, no murmurs ABD:  Flat, positive bowel sounds normal in frequency in pitch, no bruits, no rebound, no guarding, no midline pulsatile mass, no hepatomegaly, no splenomegaly EXT:  2 plus pulses throughout, no edema, no cyanosis no clubbing SKIN:  No rashes no nodules NEURO:  Cranial nerves II through XII grossly intact, motor grossly intact throughout PSYCH:  Cognitively intact, oriented to person place and time   EKG:  EKG is ordered today. The ekg ordered today demonstrates sinus rhythm.  Rate 71 bpm.     Recent Labs: 12/08/2015: ALT 29; BUN 18; Creatinine, Ser 1.25*; Hemoglobin 14.5; Platelets 331; Potassium 3.7; Sodium 135    Lipid Panel No results found for: CHOL, TRIG, HDL, CHOLHDL, VLDL, LDLCALC, LDLDIRECT    Wt Readings from Last 3 Encounters:  01/19/16 96.616 kg (213 lb)  12/08/15 102.059 kg (225 lb)  11/16/15 99.791 kg (220 lb)      ASSESSMENT AND PLAN:  # Chest pain: Kenneth Maddox's chest pain is somewhat atypical.  However, he has known CAD and is an active smoker.  Therefore, we will obtain an exercise Cardiolite to evaluate for ischemia.  He has nitroglycerin that is expired.  We will provide a refill.  I have also asked him to restart aspirin 81 mg daily, at least until we figure out whether his symptoms are due to ischemia.  Ideally, he would be on aspirin indefinitely given his history of MI.  He was instructed to go to the ED if he develops chest pain that does not resolve after 3 sublingual nitroglycerin.  # Hyperlipidemia: Kenneth Maddox with return when he is fasting for lipids and CMP.  # Tobacco abuse: Kenneth Maddox is not interested in smoking cessation.  We discussed smoking cessation  for 5 minutes.  He will let us know if he changes his mind and wants assistance.  Current medicines are reviewed at length with the patient today.  The patient does not have concerns regarding medicines.  The following changes have been made:  Start aspirin  and nitroglycerin prn.    Labs/ tests ordered today include:   Orders Placed This Encounter  Procedures  . Lipid panel  . Comprehensive metabolic panel  . Myocardial Perfusion Imaging  . EKG 12-Lead     Disposition:   FU with Lindamarie Maclachlan C. Duke Salvia, MD, [facility] in 6 months.    This note was written with the assistance of speech recognition software.  Please excuse any transcriptional errors.  Signed, Kadra Kohan C.  Duke Salvia, MD, [facility]  01/19/2016 5:38 PM    [facility]

## 2016-01-19 ENCOUNTER — Encounter: Payer: Self-pay | Admitting: Cardiovascular Disease

## 2016-01-19 ENCOUNTER — Ambulatory Visit (INDEPENDENT_AMBULATORY_CARE_PROVIDER_SITE_OTHER): Payer: Managed Care, Other (non HMO) | Admitting: Cardiovascular Disease

## 2016-01-19 VITALS — BP 130/90 | HR 71 | Ht 76.0 in | Wt 213.0 lb

## 2016-01-19 DIAGNOSIS — E785 Hyperlipidemia, unspecified: Secondary | ICD-10-CM

## 2016-01-19 DIAGNOSIS — Z79899 Other long term (current) drug therapy: Secondary | ICD-10-CM

## 2016-01-19 DIAGNOSIS — Z720 Tobacco use: Secondary | ICD-10-CM

## 2016-01-19 DIAGNOSIS — R072 Precordial pain: Secondary | ICD-10-CM | POA: Diagnosis not present

## 2016-01-19 DIAGNOSIS — I10 Essential (primary) hypertension: Secondary | ICD-10-CM | POA: Diagnosis not present

## 2016-01-19 MED ORDER — NITROGLYCERIN 0.4 MG SL SUBL: 0.4000 mg | SUBLINGUAL_TABLET | SUBLINGUAL | Status: DC | PRN

## 2016-01-19 MED ORDER — ASPIRIN EC 81 MG PO TBEC: 81.0000 mg | DELAYED_RELEASE_TABLET | Freq: Every day | ORAL | Status: DC

## 2016-01-19 NOTE — Patient Instructions (Signed)
Start ASPIRIN 81 MG  ENTERIC COATED ONE TABLET DAILY  REFILLED NTG SUBLINGUAL TABLETS  LABS--LIPID , CMP - DO NOT EAT ORD DRINK THE MORNING OF THE TEST.  Your physician has requested that you have en exercise stress myoview. For further information please visit [email or web address] Please follow instruction sheet, as given. WILL CALL YOU WITH RESULTS  Your physician wants you to follow-up in 6 MONTHS WITH DR Mills .  You will receive a reminder letter in the mail two months in advance. If you don't receive a letter, please call our office to schedule the follow-up appointment.  If you need a refill on your cardiac medications before your next appointment, please call your pharmacy.

## 2016-01-22 ENCOUNTER — Encounter: Payer: Self-pay | Admitting: *Deleted

## 2016-01-24 ENCOUNTER — Telehealth (HOSPITAL_COMMUNITY): Payer: Self-pay

## 2016-01-24 NOTE — Telephone Encounter (Signed)
Encounter complete. 

## 2016-01-26 ENCOUNTER — Ambulatory Visit (HOSPITAL_COMMUNITY)
Admission: RE | Admit: 2016-01-26 | Discharge: 2016-01-26 | Disposition: A | Discharge status: Home / Self Care | Payer: Managed Care, Other (non HMO) | Source: Ambulatory Visit | Attending: Cardiology | Admitting: Cardiology

## 2016-01-26 DIAGNOSIS — I10 Essential (primary) hypertension: Secondary | ICD-10-CM | POA: Diagnosis not present

## 2016-01-26 DIAGNOSIS — R0609 Other forms of dyspnea: Secondary | ICD-10-CM | POA: Diagnosis not present

## 2016-01-26 DIAGNOSIS — R072 Precordial pain: Secondary | ICD-10-CM | POA: Insufficient documentation

## 2016-01-26 DIAGNOSIS — R0602 Shortness of breath: Secondary | ICD-10-CM | POA: Diagnosis not present

## 2016-01-26 DIAGNOSIS — F17200 Nicotine dependence, unspecified, uncomplicated: Secondary | ICD-10-CM | POA: Insufficient documentation

## 2016-01-26 DIAGNOSIS — R5383 Other fatigue: Secondary | ICD-10-CM | POA: Diagnosis not present

## 2016-01-26 DIAGNOSIS — E785 Hyperlipidemia, unspecified: Secondary | ICD-10-CM | POA: Diagnosis not present

## 2016-01-26 LAB — MYOCARDIAL PERFUSION IMAGING
CHL CUP NUCLEAR SDS: 0
CHL CUP NUCLEAR SSS: 1
CHL RATE OF PERCEIVED EXERTION: 17
CSEPEW: 10.1 METS
CSEPHR: 86 %
Exercise duration (min): 9 min
Exercise duration (sec): 0 s
LV dias vol: 106 mL
LV sys vol: 47 mL
MPHR: 162 {beats}/min
Peak HR: 146 {beats}/min
Rest HR: 67 {beats}/min
SRS: 1
TID: 0.94

## 2016-01-26 MED ORDER — TECHNETIUM TC 99M SESTAMIBI GENERIC - CARDIOLITE
29.5000 | Freq: Once | INTRAVENOUS | Status: AC | PRN
  Administered 2016-01-26: 30 via INTRAVENOUS

## 2016-01-26 MED ORDER — TECHNETIUM TC 99M SESTAMIBI GENERIC - CARDIOLITE
10.9000 | Freq: Once | INTRAVENOUS | Status: AC | PRN
  Administered 2016-01-26: 10.9 via INTRAVENOUS

## 2016-01-27 ENCOUNTER — Encounter: Payer: Self-pay | Admitting: Cardiovascular Disease

## 2016-01-27 DIAGNOSIS — E781 Pure hyperglyceridemia: Secondary | ICD-10-CM

## 2016-01-27 HISTORY — DX: Pure hyperglyceridemia: E78.1

## 2016-01-27 LAB — COMPREHENSIVE METABOLIC PANEL
ALT: 26 U/L (ref 9–46)
AST: 22 U/L (ref 10–35)
Albumin: 4.1 g/dL (ref 3.6–5.1)
Alkaline Phosphatase: 68 U/L (ref 40–115)
BILIRUBIN TOTAL: 0.4 mg/dL (ref 0.2–1.2)
BUN: 22 mg/dL (ref 7–25)
CO2: 26 mmol/L (ref 20–31)
CREATININE: 1.12 mg/dL (ref 0.70–1.33)
Calcium: 9.3 mg/dL (ref 8.6–10.3)
Chloride: 101 mmol/L (ref 98–110)
GLUCOSE: 94 mg/dL (ref 65–99)
Potassium: 4.1 mmol/L (ref 3.5–5.3)
SODIUM: 136 mmol/L (ref 135–146)
Total Protein: 6.8 g/dL (ref 6.1–8.1)

## 2016-01-27 LAB — LIPID PANEL
CHOL/HDL RATIO: 9.1 ratio — AB (ref <=5.0)
CHOLESTEROL: 237 mg/dL — AB (ref 125–200)
HDL: 26 mg/dL — ABNORMAL LOW (ref >=40)
Triglycerides: 725 mg/dL — ABNORMAL HIGH (ref <150)

## 2016-01-27 LAB — LDL CHOLESTEROL, DIRECT: Direct LDL: 81 mg/dL (ref <130)

## 2016-01-31 ENCOUNTER — Other Ambulatory Visit: Payer: Self-pay | Admitting: *Deleted

## 2016-01-31 ENCOUNTER — Telehealth: Payer: Self-pay | Admitting: *Deleted

## 2016-01-31 MED ORDER — FENOFIBRATE 150 MG PO CAPS: 150.0000 mg | ORAL_CAPSULE | Freq: Every day | ORAL | Status: DC

## 2016-01-31 MED ORDER — FISH OIL 1000 MG PO CAPS
2000.0000 mg | ORAL_CAPSULE | Freq: Two times a day (BID) | ORAL | Status: DC
Start: 2016-01-31 — End: 2017-04-01

## 2016-01-31 NOTE — Telephone Encounter (Signed)
-----   Message from Chilton Si, MD sent at 01/27/2016  6:32 AM EST ----- Triglycerides are extremely high.  This puts him at risk of pancreatitis (inflammation of the pancreas).  Recommend a diet low in carbohydrates and limiting alcohol intake.  Start fenofibrate 150 mg daily.  Follow up in 1 month to discuss further.

## 2016-01-31 NOTE — Telephone Encounter (Signed)
Spoke to patient and wife . Start  Medication  Follow up appt with Dr Duke Salvia  also per Belenda Cruise start fish oil 2 tablets twice a day Verbalized understanding.

## 2016-02-05 ENCOUNTER — Telehealth: Payer: Self-pay | Admitting: *Deleted

## 2016-02-05 MED ORDER — FENOFIBRATE 145 MG PO TABS: 145.0000 mg | ORAL_TABLET | Freq: Every day | ORAL | Status: DC

## 2016-02-05 NOTE — Telephone Encounter (Signed)
150 MG FENOFIBRATE NEEDS PRIOR AUTHORIZATION  PATIENT HAS TO TRY FENOFIBRATE 145 MG OR LIPOFEN, FENOFIBRIC ACID   OKAY PER DR [place] TO CHANGE TO 145 MG. E SENT MEDICATIONS

## 2016-06-24 ENCOUNTER — Other Ambulatory Visit: Payer: Self-pay | Admitting: *Deleted

## 2016-06-24 MED ORDER — FENOFIBRATE 145 MG PO TABS: 145.0000 mg | ORAL_TABLET | Freq: Every day | ORAL | Status: DC

## 2016-06-24 NOTE — Telephone Encounter (Signed)
Received fax from [facility] for refill on Fenofibrate Nano 145 mg Sent via [employer]

## 2016-10-07 ENCOUNTER — Telehealth: Payer: Self-pay | Admitting: *Deleted

## 2016-10-07 NOTE — Telephone Encounter (Signed)
Request for surgical clearance:  1. What type of surgery is being performed? Left thumb foreign body removal   2. When is this surgery scheduled? 10/09/2016    3. Are there any medications that need to be held prior to surgery and how long?   4. Name of physician performing surgery? Dr Frederico Hammananiel Caffrey         5.  What is your office phone and fax number? Phone [phone], fax # [phone] [facility]

## 2016-10-09 NOTE — Telephone Encounter (Signed)
Sent via Epic

## 2016-10-09 NOTE — Telephone Encounter (Signed)
Low risk for surgery. 

## 2017-02-26 ENCOUNTER — Other Ambulatory Visit: Payer: Self-pay | Admitting: Cardiovascular Disease

## 2017-02-26 NOTE — Progress Notes (Signed)
Cholesterol levels reviewed.  02/13/17: Total cholesterol 260, triglycerides 346, HDL 29, LDL 172.  Goal LDL is <70.  We will start atorvastatin 80 mg.  Check lipids and CMP in 6 weeks.  Mr. Ardyth Galrigloff is due for follow up with me.  Please schedule.   Kani Jobson C. Duke Salviaandolph, MD, [facility]  02/26/2017  6:17 PM

## 2017-02-28 ENCOUNTER — Telehealth: Payer: Self-pay | Admitting: *Deleted

## 2017-02-28 DIAGNOSIS — Z5181 Encounter for therapeutic drug level monitoring: Secondary | ICD-10-CM

## 2017-02-28 DIAGNOSIS — E785 Hyperlipidemia, unspecified: Secondary | ICD-10-CM

## 2017-02-28 MED ORDER — ATORVASTATIN CALCIUM 80 MG PO TABS: 80.0000 mg | ORAL_TABLET | Freq: Every day | ORAL | 3 refills | Status: DC

## 2017-02-28 NOTE — Telephone Encounter (Signed)
Discussed interaction with Fenofibrate and Atorvastatin D/C Fenofibrate and start Atorvastatin Advised wife of medication changes, follow up labs, and scheduled follow up appointment    Chilton Siiffany [place], MD  Burnell BlanksMelinda B Kaiden [place]; Lutricia HorsfallBillie D Robinson Cc: [place] I [place]        Cholesterol levels reviewed. LDL cholesterol should be less than 70. It is 172. He needs to be on a statin. Restart atorvastatin 80 mg daily. Check lipids and CMP in 6 weeks. Mr. Ardyth Galrigloff is due for follow-up with me. Please schedule.

## 2017-04-01 ENCOUNTER — Ambulatory Visit (INDEPENDENT_AMBULATORY_CARE_PROVIDER_SITE_OTHER): Payer: Managed Care, Other (non HMO) | Admitting: Cardiovascular Disease

## 2017-04-01 ENCOUNTER — Other Ambulatory Visit: Payer: Self-pay | Admitting: Cardiovascular Disease

## 2017-04-01 ENCOUNTER — Encounter: Payer: Self-pay | Admitting: Cardiovascular Disease

## 2017-04-01 VITALS — BP 123/78 | HR 79 | Ht 76.0 in | Wt 219.4 lb

## 2017-04-01 DIAGNOSIS — Z720 Tobacco use: Secondary | ICD-10-CM | POA: Diagnosis not present

## 2017-04-01 DIAGNOSIS — E781 Pure hyperglyceridemia: Secondary | ICD-10-CM

## 2017-04-01 DIAGNOSIS — Z955 Presence of coronary angioplasty implant and graft: Secondary | ICD-10-CM | POA: Diagnosis not present

## 2017-04-01 DIAGNOSIS — E785 Hyperlipidemia, unspecified: Secondary | ICD-10-CM | POA: Diagnosis not present

## 2017-04-01 LAB — LIPID PANEL
Cholesterol: 128 mg/dL (ref <200)
HDL: 25 mg/dL — ABNORMAL LOW (ref >40)
LDL CALC: 59 mg/dL (ref <100)
Total CHOL/HDL Ratio: 5.1 Ratio — ABNORMAL HIGH (ref <5.0)
Triglycerides: 220 mg/dL — ABNORMAL HIGH (ref <150)
VLDL: 44 mg/dL — AB (ref <30)

## 2017-04-01 LAB — COMPREHENSIVE METABOLIC PANEL
ALT: 36 U/L (ref 9–46)
AST: 26 U/L (ref 10–35)
Albumin: 4.7 g/dL (ref 3.6–5.1)
Alkaline Phosphatase: 94 U/L (ref 40–115)
BUN: 19 mg/dL (ref 7–25)
CHLORIDE: 104 mmol/L (ref 98–110)
CO2: 25 mmol/L (ref 20–31)
CREATININE: 1.19 mg/dL (ref 0.70–1.33)
Calcium: 10.3 mg/dL (ref 8.6–10.3)
GLUCOSE: 95 mg/dL (ref 65–99)
Potassium: 4.7 mmol/L (ref 3.5–5.3)
SODIUM: 139 mmol/L (ref 135–146)
Total Bilirubin: 0.5 mg/dL (ref 0.2–1.2)
Total Protein: 7.5 g/dL (ref 6.1–8.1)

## 2017-04-01 NOTE — Progress Notes (Signed)
Cardiology Office Note   Date:  04/02/2017   ID:  Kenneth Maddox, DOB 12/16/1960, MRN [id number]  PCP:  Isabella Stalling, MD  Cardiologist:   Chilton Si, MD   Chief Complaint  Patient presents with  . Follow-up      History of Present Illness: Kenneth Maddox is a 56 y.o. male with CAD s/p MI (DES to LCx in 2010), hypertension, COPD, hypertriglyceridemia, and hyperlipidemia who presents for follow-up. He was first seen 01/2016 for an evaluation of chest discomfort.  He reported atypical chest pain and was referred for exercise Myoview 01/2016 that revealed LVEF 56% and no ischemia.  He achieved 10.1 METs on a Bruce protocol.  His last appointment he was started on fenofibrate and fish oil for hypertriglyceridemia. Subsequent labs revealed an improvement from 700s to 300s. However, his LDL was 172. Fenofibrate was stopped and he was started on atorvastatin 80 mg daily.   Since his last appointment Kenneth Maddox has been doing well.  He continues to smoke 1 ppd and isn't interested in quitting.  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  He notes shortness of breath with exertion at times.   Kenneth Maddox smokes 1 ppd x40 years.  He notes occasional episodes of right-sided chest pain. This typically gets better with movement. He denies any exertional chest pain.  He is sometimes short of breath with exertion, but he attributes this to her smoking. It typically occurs when he hurries or tries to walk long distances. He denies lower extremity edema, orthopnea, or PND. 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   Past Medical History:  Diagnosis Date  . Complication of anesthesia   . COPD (chronic obstructive pulmonary disease) (HCC)   . Coronary artery disease   . Hyperlipidemia LDL goal < 70   . Hypertension   . Hypertriglyceridemia 01/27/2016  . Myocardial infarction (HCC)   . PONV (postoperative nausea and vomiting)   . Presence of stent in  left circumflex coronary artery 06/2010   Ion EES 3.0 mm x 16 mm; proximal left circumflex  . Thyroid disease     Past Surgical History:  Procedure Laterality Date  . APPENDECTOMY    . CORONARY STENT PLACEMENT    . EXTERNAL EAR SURGERY    . LAPAROSCOPIC APPENDECTOMY N/A 11/16/2015   Procedure: APPENDECTOMY LAPAROSCOPIC;  Surgeon: Franky Macho, MD;  Location: [facility] ORS;  Service: General;  Laterality: N/A;  . LEFT HEART CATHETERIZATION WITH CORONARY ANGIOGRAM N/A 02/26/2012   Procedure: LEFT HEART CATHETERIZATION WITH CORONARY ANGIOGRAM;  Surgeon: Marykay Lex, MD;  Location: [facility] CATH LAB;  Service: Cardiovascular;  Laterality: N/A;     Current Outpatient Prescriptions  Medication Sig Dispense Refill  . atorvastatin (LIPITOR) 80 MG tablet Take 1 tablet (80 mg total) by mouth daily. 90 tablet 3  . nitroGLYCERIN (NITROSTAT) 0.4 MG SL tablet Place 1 tablet (0.4 mg total) under the tongue every 5 (five) minutes as needed for chest pain. 25 tablet 5   No current facility-administered medications for this visit.     Allergies:   Patient has no known allergies.    Social History:  The patient  reports that he has been smoking.  He has a 40.00 pack-year smoking history. He has never used smokeless tobacco. He reports that he does not drink alcohol or use drugs.   Family History:  The patient's family history includes Diabetes in his daughter; Leukemia in  his mother.    ROS:  Please see the history of present illness.   Otherwise, review of systems are positive for knee pain.   All other systems are reviewed and negative.    PHYSICAL EXAM: VS:  BP 123/78   Pulse 79   Ht  (1.93 m)   Wt 99.5 kg (219 lb 6.4 oz)   BMI 26.71 kg/m  , BMI Body mass index is 26.71 kg/m. GENERAL:  Well appearing.  No acute distress HEENT:  Pupils equal round and reactive, fundi not visualized, oral mucosa unremarkable NECK:  No jugular venous distention, waveform within normal limits, carotid upstroke  brisk and symmetric, no bruits, no thyromegaly LYMPHATICS:  No cervical adenopathy LUNGS:  Clear to auscultation bilaterally.  No crackles, rhonchi, or wheezes. HEART:  RRR.  PMI not displaced or sustained,S1 and S2 within normal limits, no S3, no S4, no clicks, no rubs, no murmurs ABD:  Flat, positive bowel sounds normal in frequency in pitch, no bruits, no rebound, no guarding, no midline pulsatile mass, no hepatomegaly, no splenomegaly EXT:  2 plus pulses throughout, no edema, no cyanosis no clubbing SKIN:  No rashes no nodules NEURO:  Cranial nerves II through XII grossly intact, motor grossly intact throughout PSYCH:  Cognitively intact, oriented to person place and time   EKG:  EKG is ordered today. The ekg ordered 01/19/16 demonstrates sinus rhythm.  Rate 71 bpm.   04/01/17: Sinus rhythm. Rate 79 bpm.  Exercise Myoview 01/26/16:  The left ventricular ejection fraction is normal (55-65%).  Nuclear stress EF: 56%.  There was no ST segment deviation noted during stress. ? Hypotensive BP response. BP dropped in stage 3 from 161/11mmHg to 111/3mmHg and then back to 158/57mmHg in recovery. ? accuracy of assessment,.  The study is normal.  This is a low risk study.  Recent Labs: 04/01/2017: ALT 36; BUN 19; Creat 1.19; Potassium 4.7; Sodium 139    Lipid Panel    Component Value Date/Time   CHOL 128 04/01/2017 0802   TRIG 220 (H) 04/01/2017 0802   HDL 25 (L) 04/01/2017 0802   CHOLHDL 5.1 (H) 04/01/2017 0802   VLDL 44 (H) 04/01/2017 0802   LDLCALC 59 04/01/2017 0802   LDLDIRECT 81 01/26/2016 1008   02/13/17: Total cholesterol 260, Triglycerides 346, HDL 29, LDL 172  03/19/16: Sodium 139, potassium 4.8, BUN 23, creatinine 1.26 AST 24, ALT 22 Hemoglobin A1c 5.7%  Wt Readings from Last 3 Encounters:  04/01/17 99.5 kg (219 lb 6.4 oz)  01/26/16 96.6 kg (213 lb)  01/19/16 96.6 kg (213 lb)      ASSESSMENT AND PLAN:  # CAD s/p PCI:  # Chest pain: Kenneth Maddox's chest pain is  atypical and does not occur with exertion. He had a negative stress test 01/2016. No additional testing indicated at this time.  Continue atorvastatin and restart aspirin.  # Hyperlipidemia:  # Hypertriglyceridemia: Triglycerides improved with fenofibrate. However, his LDL is 172. We will switch fenofibrate to atorvastatin. Repeat lipids and CMP in 6 weeks.  # Tobacco abuse: Kenneth Maddox is not interested in smoking cessation.  We discussed smoking cessation for 5 minutes.  He will let us know if he changes his mind and wants assistance.  Current medicines are reviewed at length with the patient today.  The patient does not have concerns regarding medicines.  The following changes have been made:  Start aspirin  daily.   Labs/ tests ordered today include:   No orders of the  defined types were placed in this encounter.    Disposition:   FU with Kenneth Ballen C. Duke Salvia, MD, [facility] in 6 months.    This note was written with the assistance of speech recognition software.  Please excuse any transcriptional errors.  Signed, Tristan Proto C. Duke Salvia, MD, [facility]  04/02/2017 5:53 PM    [facility]

## 2017-04-01 NOTE — Patient Instructions (Signed)

## 2017-04-08 ENCOUNTER — Telehealth: Payer: Self-pay | Admitting: *Deleted

## 2017-04-08 NOTE — Telephone Encounter (Signed)
Copy given to wife, ok per patient   Notes recorded by Chilton Si, MD on 04/04/2017 at 6:32 PM EDT Kidney function, liver function and electrolytes are normal. Cholesterol levels are much better! Triglycerides are still a little high. Recommend taking fish oil 1000 mg daily. Continue all other meds.

## 2017-11-25 ENCOUNTER — Ambulatory Visit (HOSPITAL_COMMUNITY)
Admission: RE | Admit: 2017-11-25 | Discharge: 2017-11-25 | Disposition: A | Discharge status: Home / Self Care | Payer: Managed Care, Other (non HMO) | Source: Ambulatory Visit | Attending: Physician Assistant | Admitting: Physician Assistant

## 2017-11-25 ENCOUNTER — Other Ambulatory Visit (HOSPITAL_COMMUNITY): Payer: Self-pay | Admitting: Physician Assistant

## 2017-11-25 DIAGNOSIS — R51 Headache: Secondary | ICD-10-CM | POA: Insufficient documentation

## 2017-11-25 DIAGNOSIS — R519 Headache, unspecified: Secondary | ICD-10-CM

## 2018-01-31 ENCOUNTER — Other Ambulatory Visit: Payer: Self-pay | Admitting: Cardiovascular Disease

## 2018-02-02 NOTE — Telephone Encounter (Signed)
Please review for refill, thanks ! 

## 2018-02-02 NOTE — Telephone Encounter (Signed)
REFILL 

## 2018-02-06 ENCOUNTER — Encounter: Payer: Self-pay | Admitting: Cardiovascular Disease

## 2018-02-06 ENCOUNTER — Ambulatory Visit (INDEPENDENT_AMBULATORY_CARE_PROVIDER_SITE_OTHER): Payer: Managed Care, Other (non HMO) | Admitting: Cardiovascular Disease

## 2018-02-06 VITALS — BP 122/74 | HR 73 | Ht 73.0 in | Wt 221.0 lb

## 2018-02-06 DIAGNOSIS — E785 Hyperlipidemia, unspecified: Secondary | ICD-10-CM

## 2018-02-06 DIAGNOSIS — I739 Peripheral vascular disease, unspecified: Secondary | ICD-10-CM | POA: Diagnosis not present

## 2018-02-06 DIAGNOSIS — Z5181 Encounter for therapeutic drug level monitoring: Secondary | ICD-10-CM | POA: Diagnosis not present

## 2018-02-06 DIAGNOSIS — R079 Chest pain, unspecified: Secondary | ICD-10-CM

## 2018-02-06 DIAGNOSIS — I200 Unstable angina: Secondary | ICD-10-CM | POA: Diagnosis not present

## 2018-02-06 LAB — LIPID PANEL
CHOL/HDL RATIO: 4.8 ratio (ref 0.0–5.0)
Cholesterol, Total: 121 mg/dL (ref 100–199)
HDL: 25 mg/dL — AB (ref >39)
LDL Calculated: 47 mg/dL (ref 0–99)
Triglycerides: 245 mg/dL — ABNORMAL HIGH (ref 0–149)
VLDL Cholesterol Cal: 49 mg/dL — ABNORMAL HIGH (ref 5–40)

## 2018-02-06 LAB — CBC WITH DIFFERENTIAL/PLATELET
Basophils Absolute: 0 10*3/uL (ref 0.0–0.2)
Basos: 0 %
EOS (ABSOLUTE): 0.2 10*3/uL (ref 0.0–0.4)
EOS: 2 %
HEMATOCRIT: 46.7 % (ref 37.5–51.0)
Hemoglobin: 16.7 g/dL (ref 13.0–17.7)
Immature Grans (Abs): 0 10*3/uL (ref 0.0–0.1)
Immature Granulocytes: 0 %
Lymphocytes Absolute: 3 10*3/uL (ref 0.7–3.1)
Lymphs: 29 %
MCH: 32.9 pg (ref 26.6–33.0)
MCHC: 35.8 g/dL — AB (ref 31.5–35.7)
MCV: 92 fL (ref 79–97)
MONOS ABS: 0.7 10*3/uL (ref 0.1–0.9)
Monocytes: 6 %
Neutrophils Absolute: 6.7 10*3/uL (ref 1.4–7.0)
Neutrophils: 63 %
Platelets: 213 10*3/uL (ref 150–379)
RBC: 5.08 x10E6/uL (ref 4.14–5.80)
RDW: 14.3 % (ref 12.3–15.4)
WBC: 10.6 10*3/uL (ref 3.4–10.8)

## 2018-02-06 LAB — COMPREHENSIVE METABOLIC PANEL
ALBUMIN: 4.7 g/dL (ref 3.5–5.5)
ALT: 34 IU/L (ref 0–44)
AST: 28 IU/L (ref 0–40)
Albumin/Globulin Ratio: 1.8 (ref 1.2–2.2)
Alkaline Phosphatase: 89 IU/L (ref 39–117)
BUN/Creatinine Ratio: 15 (ref 9–20)
BUN: 18 mg/dL (ref 6–24)
Bilirubin Total: 0.5 mg/dL (ref 0.0–1.2)
CALCIUM: 9.9 mg/dL (ref 8.7–10.2)
CHLORIDE: 103 mmol/L (ref 96–106)
CO2: 24 mmol/L (ref 20–29)
CREATININE: 1.23 mg/dL (ref 0.76–1.27)
GFR calc non Af Amer: 66 mL/min/{1.73_m2} (ref >59)
GFR, EST AFRICAN AMERICAN: 76 mL/min/{1.73_m2} (ref >59)
Globulin, Total: 2.6 g/dL (ref 1.5–4.5)
Glucose: 82 mg/dL (ref 65–99)
Potassium: 4.7 mmol/L (ref 3.5–5.2)
Sodium: 142 mmol/L (ref 134–144)
TOTAL PROTEIN: 7.3 g/dL (ref 6.0–8.5)

## 2018-02-06 LAB — PROTIME-INR
INR: 1 (ref 0.8–1.2)
PROTHROMBIN TIME: 10.8 s (ref 9.1–12.0)

## 2018-02-06 LAB — APTT: aPTT: 29 s (ref 24–33)

## 2018-02-06 MED ORDER — ATORVASTATIN CALCIUM 80 MG PO TABS: 80.0000 mg | ORAL_TABLET | Freq: Every day | ORAL | 3 refills | Status: DC

## 2018-02-06 NOTE — Progress Notes (Signed)
Cardiology Office Note   Date:  02/07/2018   ID:  Zaelyn Barbary, DOB 1961-10-19, MRN [id number]  PCP:  Oval Linsey, MD  Cardiologist:   Chilton Si, MD   Chief Complaint  Patient presents with  . Follow-up    left side tingling-leg and arm going to sleep      History of Present Illness: Jeriel Vivanco is a 57 y.o. male with CAD s/p MI (DES to LCx in 2010), hypertension, COPD, hypertriglyceridemia, and hyperlipidemia who presents for follow-up. He was first seen 01/2016 for an evaluation of chest discomfort.  He reported atypical chest pain and was referred for exercise Myoview 01/2016 that revealed LVEF 56% and no ischemia.  He achieved 10.1 METs on a Bruce protocol.  He was started on fenofibrate and fish oil for hypertriglyceridemia. Subsequent labs revealed an improvement from 700s to 300s. However, his LDL was 172. Fenofibrate was stopped and he was started on atorvastatin 80 mg daily.    Since his last appointment Mr. Akhtar has experienced exertional chest pain.  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  He gets short of breath and has substernal chest pressure but denies nausea or diaphoresis.  The pain is sharp and seems worse at some times than others.  He also notes pain in his left leg when walking.  There is no edema, orthopnea or PND.  He continues to smoke 1.5 ppd.  He has tried Chantix, patches and E-cigarettes.  He doesn't think that he is ready to try again.  He has been gaining weight but not eating much.  He doesn't get any exercise outside of work.    Past Medical History:  Diagnosis Date  . Complication of anesthesia   . COPD (chronic obstructive pulmonary disease) (HCC)   . Coronary artery disease   . Hyperlipidemia LDL goal < 70   . Hypertension   . Hypertriglyceridemia 01/27/2016  . Myocardial infarction (HCC)   . PONV (postoperative nausea and vomiting)   . Presence of stent in left circumflex coronary artery 06/2010   Ion EES 3.0 mm x 16 mm; proximal left circumflex  . Thyroid disease     Past Surgical History:  Procedure Laterality Date  . APPENDECTOMY    . CORONARY STENT PLACEMENT    . EXTERNAL EAR SURGERY    . LAPAROSCOPIC APPENDECTOMY N/A 11/16/2015   Procedure: APPENDECTOMY LAPAROSCOPIC;  Surgeon: Franky Macho, MD;  Location: [facility];  Service: General;  Laterality: N/A;  . LEFT HEART CATHETERIZATION WITH CORONARY ANGIOGRAM N/A 02/26/2012   Procedure: LEFT HEART CATHETERIZATION WITH CORONARY ANGIOGRAM;  Surgeon: Marykay Lex, MD;  Location: [facility] CATH LAB;  Service: Cardiovascular;  Laterality: N/A;     Current Outpatient Medications  Medication Sig Dispense Refill  . aspirin EC 81 MG tablet Take 81 mg by mouth daily.    Marland Kitchen atorvastatin (LIPITOR) 80 MG tablet Take 1 tablet (80 mg total) by mouth daily at 6 PM. 90 tablet 3  . nitroGLYCERIN (NITROSTAT) 0.4 MG SL tablet Place 1 tablet (0.4 mg total) under the tongue every 5 (five) minutes as needed for chest pain. 25 tablet 5  . Omega-3 Fatty Acids (FISH OIL) 1000 MG CAPS Take 1,000 mg by mouth daily.      No current facility-administered medications for this visit.     Allergies:   Patient has no known allergies.    Social History:  The patient  reports that he has been smoking.  He has a 40.00 pack-year smoking history. he has never used smokeless tobacco. He reports that he does not drink alcohol or use drugs.   Family History:  The patient's family history includes Diabetes in his daughter; Leukemia in his mother.    ROS:  Please see the history of present illness.   Otherwise, review of systems are positive for knee pain.   All other systems are reviewed and negative.    PHYSICAL EXAM: VS:  BP 122/74   Pulse 73   Ht 6\' 1"  (1.854 m)   Wt 221 lb (100.2 kg)   BMI 29.16 kg/m  , BMI Body mass index is 29.16 kg/m. GENERAL:  Well appearing HEENT: Pupils equal round and reactive, fundi not visualized, oral mucosa unremarkable NECK:  No  jugular venous distention, waveform within normal limits, carotid upstroke brisk and symmetric, no bruits LUNGS:  Clear to auscultation bilaterally HEART:  RRR.  PMI not displaced or sustained,S1 and S2 within normal limits, no S3, no S4, no clicks, no rubs, no murmurs ABD:  Flat, positive bowel sounds normal in frequency in pitch, no bruits, no rebound, no guarding, no midline pulsatile mass, no hepatomegaly, no splenomegaly EXT: 1+ LLE pulses, 2+ R LE DP/PT no edema, no cyanosis no clubbing SKIN:  No rashes no nodules NEURO:  Cranial nerves II through XII grossly intact, motor grossly intact throughout PSYCH:  Cognitively intact, oriented to person place and time   EKG:  EKG is not ordered today. The ekg ordered 01/19/16 demonstrates sinus rhythm.  Rate 71 bpm.   04/01/17: Sinus rhythm. Rate 79 bpm.  Exercise Myoview 01/26/16:  The left ventricular ejection fraction is normal (55-65%).  Nuclear stress EF: 56%.  There was no ST segment deviation noted during stress. ? Hypotensive BP response. BP dropped in stage 3 from 161/47mmHg to 111/22mmHg and then back to 158/49mmHg in recovery. ? accuracy of assessment,.  The study is normal.  This is a low risk study.  Recent Labs: 02/06/2018: ALT 34; BUN 18; Creatinine, Ser 1.23; Hemoglobin 16.7; Platelets 213; Potassium 4.7; Sodium 142    Lipid Panel    Component Value Date/Time   CHOL 121 02/06/2018 0956   TRIG 245 (H) 02/06/2018 0956   HDL 25 (L) 02/06/2018 0956   CHOLHDL 4.8 02/06/2018 0956   CHOLHDL 5.1 (H) 04/01/2017 0802   VLDL 44 (H) 04/01/2017 0802   LDLCALC 47 02/06/2018 0956   LDLDIRECT 81 01/26/2016 1008   02/13/17: Total cholesterol 260, Triglycerides 346, HDL 29, LDL 172  03/19/16: Sodium 139, potassium 4.8, BUN 23, creatinine 1.26 AST 24, ALT 22 Hemoglobin A1c 5.7%  Wt Readings from Last 3 Encounters:  02/06/18 221 lb (100.2 kg)  04/01/17 219 lb 6.4 oz (99.5 kg)  01/26/16 213 lb (96.6 kg)      ASSESSMENT AND  PLAN:  # CAD s/p PCI:  # Chest pain: 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  He has a stent in the LCx.  We discussed the options of repeating his stress or proceeding with cath.  He prefers to get a LHC.  He previously stopped taking aspirin because it made him bleed if he gets scratched at work. He will resume aspirin  81mg  daily.  Continue atorvastatin.  # Hyperlipidemia:  # Hypertriglyceridemia: Repeat lipids and CMP.  Continue atorvastatin and fish oil.  Consider Vescepa if triglycerides remain elevated.  # Tobacco abuse: Mr. Hane is not interested in smoking cessation.  We discussed smoking cessation for 5 minutes.  He  will let us know if he changes his mind and wants assistance.  # Claudication: Check ABIs.  Advised smoking cessation as above.  Aspirin and statin.   Current medicines are reviewed at length with the patient today.  The patient does not have concerns regarding medicines.  The following changes have been made:  Start aspirin 81mg  daily.   Labs/ tests ordered today include:   Orders Placed This Encounter  Procedures  . Comprehensive metabolic panel  . Lipid panel  . CBC with Differential/Platelet  . INR/PT  . PTT     Disposition:   FU with Delonte Musich C. Duke Salviaandolph, MD, [facility] in 3 months.    This note was written with the assistance of speech recognition software.  Please excuse any transcriptional errors.  Signed, Latravis Grine C. Duke Salviaandolph, MD, [facility]  02/07/2018 5:47 PM    [facility]

## 2018-02-06 NOTE — Patient Instructions (Addendum)
Medication Instructions:  START ASPIRIN 81 MG DAILY   Labwork: LP/CMET/CBC/PT/INR/PTT TODAY   Testing/Procedures: Your physician has requested that you have an ankle brachial index (ABI). During this test an ultrasound and blood pressure cuff are used to evaluate the arteries that supply the arms and legs with blood. Allow thirty minutes for this exam. There are no restrictions or special instructions.  Your physician has requested that you have a cardiac catheterization. Cardiac catheterization is used to diagnose and/or treat various heart conditions. Doctors may recommend this procedure for a number of different reasons. The most common reason is to evaluate chest pain. Chest pain can be a symptom of coronary artery disease (CAD), and cardiac catheterization can show whether plaque is narrowing or blocking your heart's arteries. This procedure is also used to evaluate the valves, as well as measure the blood flow and oxygen levels in different parts of your heart. For further information please visit [email or web address] Please follow instruction sheet, as given.  Follow-Up: Your physician recommends that you schedule a follow-up appointment in: [facility]                   [facility] [address] [place] [address] Dept: [phone] Loc: [phone]  Marcine MatarRoger Moon  02/06/2018  You are scheduled for a Cardiac Catheterization on Thursday, March 7 with Dr. Lance MussJayadeep Varanasi.  1. [facility] at [facility]: [address] [place], [place] [address] at 5:30 AM (two hours before your procedure to ensure your preparation). Free valet parking service is available.   Special note: Every effort is made to have your procedure done on time. Please understand that emergencies sometimes delay scheduled procedures.  2. Diet: Do not eat or drink  anything after midnight prior to your procedure except sips of water to take medications.  3. Labs: DONE IN THE OFFICE TODAY   4. Medication instructions in preparation for your procedure:  On the morning of your procedure, take your Aspirin and any morning medicines NOT listed above.  You may use sips of water.  5. Plan for one night stay--bring personal belongings. 6. Bring a current list of your medications and current insurance cards. 7. You MUST have a responsible person to drive you home. 8. Someone MUST be with you the first 24 hours after you arrive home or your discharge will be delayed. 9. Please wear clothes that are easy to get on and off and wear slip-on shoes.  Thank you for allowing us to care for you!   -- [facility]

## 2018-02-06 NOTE — H&P (View-Only) (Signed)
Cardiology Office Note   Date:  02/07/2018   ID:  Kenneth Maddox, DOB 1961-10-19, MRN [id number]  PCP:  Oval Linsey, MD  Cardiologist:   Chilton Si, MD   Chief Complaint  Patient presents with  . Follow-up    left side tingling-leg and arm going to sleep      History of Present Illness: Kenneth Maddox is a 57 y.o. male with CAD s/p MI (DES to LCx in 2010), hypertension, COPD, hypertriglyceridemia, and hyperlipidemia who presents for follow-up. He was first seen 01/2016 for an evaluation of chest discomfort.  He reported atypical chest pain and was referred for exercise Myoview 01/2016 that revealed LVEF 56% and no ischemia.  He achieved 10.1 METs on a Bruce protocol.  He was started on fenofibrate and fish oil for hypertriglyceridemia. Subsequent labs revealed an improvement from 700s to 300s. However, his LDL was 172. Fenofibrate was stopped and he was started on atorvastatin 80 mg daily.    Since his last appointment Kenneth Maddox has experienced exertional chest pain.  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  He gets short of breath and has substernal chest pressure but denies nausea or diaphoresis.  The pain is sharp and seems worse at some times than others.  He also notes pain in his left leg when walking.  There is no edema, orthopnea or PND.  He continues to smoke 1.5 ppd.  He has tried Chantix, patches and E-cigarettes.  He doesn't think that he is ready to try again.  He has been gaining weight but not eating much.  He doesn't get any exercise outside of work.    Past Medical History:  Diagnosis Date  . Complication of anesthesia   . COPD (chronic obstructive pulmonary disease) (HCC)   . Coronary artery disease   . Hyperlipidemia LDL goal < 70   . Hypertension   . Hypertriglyceridemia 01/27/2016  . Myocardial infarction (HCC)   . PONV (postoperative nausea and vomiting)   . Presence of stent in left circumflex coronary artery 06/2010   Ion EES 3.0 mm x 16 mm; proximal left circumflex  . Thyroid disease     Past Surgical History:  Procedure Laterality Date  . APPENDECTOMY    . CORONARY STENT PLACEMENT    . EXTERNAL EAR SURGERY    . LAPAROSCOPIC APPENDECTOMY N/A 11/16/2015   Procedure: APPENDECTOMY LAPAROSCOPIC;  Surgeon: Franky Macho, MD;  Location: [facility];  Service: General;  Laterality: N/A;  . LEFT HEART CATHETERIZATION WITH CORONARY ANGIOGRAM N/A 02/26/2012   Procedure: LEFT HEART CATHETERIZATION WITH CORONARY ANGIOGRAM;  Surgeon: Marykay Lex, MD;  Location: [facility] CATH LAB;  Service: Cardiovascular;  Laterality: N/A;     Current Outpatient Medications  Medication Sig Dispense Refill  . aspirin EC 81 MG tablet Take 81 mg by mouth daily.    Marland Kitchen atorvastatin (LIPITOR) 80 MG tablet Take 1 tablet (80 mg total) by mouth daily at 6 PM. 90 tablet 3  . nitroGLYCERIN (NITROSTAT) 0.4 MG SL tablet Place 1 tablet (0.4 mg total) under the tongue every 5 (five) minutes as needed for chest pain. 25 tablet 5  . Omega-3 Fatty Acids (FISH OIL) 1000 MG CAPS Take 1,000 mg by mouth daily.      No current facility-administered medications for this visit.     Allergies:   Patient has no known allergies.    Social History:  The patient  reports that he has been smoking.  He has a 40.00 pack-year smoking history. he has never used smokeless tobacco. He reports that he does not drink alcohol or use drugs.   Family History:  The patient's family history includes Diabetes in his daughter; Leukemia in his mother.    ROS:  Please see the history of present illness.   Otherwise, review of systems are positive for knee pain.   All other systems are reviewed and negative.    PHYSICAL EXAM: VS:  BP 122/74   Pulse 73   Ht 6\' 1"  (1.854 m)   Wt 221 lb (100.2 kg)   BMI 29.16 kg/m  , BMI Body mass index is 29.16 kg/m. GENERAL:  Well appearing HEENT: Pupils equal round and reactive, fundi not visualized, oral mucosa unremarkable NECK:  No  jugular venous distention, waveform within normal limits, carotid upstroke brisk and symmetric, no bruits LUNGS:  Clear to auscultation bilaterally HEART:  RRR.  PMI not displaced or sustained,S1 and S2 within normal limits, no S3, no S4, no clicks, no rubs, no murmurs ABD:  Flat, positive bowel sounds normal in frequency in pitch, no bruits, no rebound, no guarding, no midline pulsatile mass, no hepatomegaly, no splenomegaly EXT: 1+ LLE pulses, 2+ R LE DP/PT no edema, no cyanosis no clubbing SKIN:  No rashes no nodules NEURO:  Cranial nerves II through XII grossly intact, motor grossly intact throughout PSYCH:  Cognitively intact, oriented to person place and time   EKG:  EKG is not ordered today. The ekg ordered 01/19/16 demonstrates sinus rhythm.  Rate 71 bpm.   04/01/17: Sinus rhythm. Rate 79 bpm.  Exercise Myoview 01/26/16:  The left ventricular ejection fraction is normal (55-65%).  Nuclear stress EF: 56%.  There was no ST segment deviation noted during stress. ? Hypotensive BP response. BP dropped in stage 3 from 161/47mmHg to 111/22mmHg and then back to 158/49mmHg in recovery. ? accuracy of assessment,.  The study is normal.  This is a low risk study.  Recent Labs: 02/06/2018: ALT 34; BUN 18; Creatinine, Ser 1.23; Hemoglobin 16.7; Platelets 213; Potassium 4.7; Sodium 142    Lipid Panel    Component Value Date/Time   CHOL 121 02/06/2018 0956   TRIG 245 (H) 02/06/2018 0956   HDL 25 (L) 02/06/2018 0956   CHOLHDL 4.8 02/06/2018 0956   CHOLHDL 5.1 (H) 04/01/2017 0802   VLDL 44 (H) 04/01/2017 0802   LDLCALC 47 02/06/2018 0956   LDLDIRECT 81 01/26/2016 1008   02/13/17: Total cholesterol 260, Triglycerides 346, HDL 29, LDL 172  03/19/16: Sodium 139, potassium 4.8, BUN 23, creatinine 1.26 AST 24, ALT 22 Hemoglobin A1c 5.7%  Wt Readings from Last 3 Encounters:  02/06/18 221 lb (100.2 kg)  04/01/17 219 lb 6.4 oz (99.5 kg)  01/26/16 213 lb (96.6 kg)      ASSESSMENT AND  PLAN:  # CAD s/p PCI:  # Chest pain: Kenneth Maddox has exertional angina.  He has a stent in the LCx.  We discussed the options of repeating his stress or proceeding with cath.  He prefers to get a LHC.  He previously stopped taking aspirin because it made him bleed if he gets scratched at work. He will resume aspirin  81mg  daily.  Continue atorvastatin.  # Hyperlipidemia:  # Hypertriglyceridemia: Repeat lipids and CMP.  Continue atorvastatin and fish oil.  Consider Vescepa if triglycerides remain elevated.  # Tobacco abuse: Kenneth Maddox is not interested in smoking cessation.  We discussed smoking cessation for 5 minutes.  He  will let Kenneth Maddox know if he changes his mind and wants assistance.  # Claudication: Check ABIs.  Advised smoking cessation as above.  Aspirin and statin.   Current medicines are reviewed at length with the patient today.  The patient does not have concerns regarding medicines.  The following changes have been made:  Start aspirin 81mg  daily.   Labs/ tests ordered today include:   Orders Placed This Encounter  Procedures  . Comprehensive metabolic panel  . Lipid panel  . CBC with Differential/Platelet  . INR/PT  . PTT     Disposition:   FU with Kenneth Maddox C. Duke Salviaandolph, MD, [facility] in 3 months.    This note was written with the assistance of speech recognition software.  Please excuse any transcriptional errors.  Signed, Neri Vieyra C. Duke Salviaandolph, MD, [facility]  02/07/2018 5:47 PM    [facility]

## 2018-02-07 ENCOUNTER — Encounter: Payer: Self-pay | Admitting: Cardiovascular Disease

## 2018-02-10 ENCOUNTER — Telehealth: Payer: Self-pay | Admitting: *Deleted

## 2018-02-10 NOTE — Telephone Encounter (Signed)
Pt [facility]: Thursday February 12, 2018 7:30 AM Verified arrival [facility] and [facility] Tower at: 5:30 AM Nothing to eat or drink after midnight prior to cath Verified no known allergies. Verified no diabetes medications.  Verified AM meds can be  taken pre-cath with sip of water including: ASA 81 mg  Confirmed patient has responsible person to drive home post procedure and observe patient for 24 hours: yes

## 2018-02-12 ENCOUNTER — Ambulatory Visit (HOSPITAL_COMMUNITY)
Admission: RE | Admit: 2018-02-12 | Discharge: 2018-02-12 | Disposition: A | Discharge status: Home / Self Care | Payer: Managed Care, Other (non HMO) | Source: Ambulatory Visit | Attending: Interventional Cardiology | Admitting: Interventional Cardiology

## 2018-02-12 ENCOUNTER — Telehealth: Payer: Self-pay | Admitting: Cardiovascular Disease

## 2018-02-12 ENCOUNTER — Other Ambulatory Visit: Payer: Self-pay | Admitting: Cardiovascular Disease

## 2018-02-12 ENCOUNTER — Encounter (HOSPITAL_COMMUNITY): Payer: Self-pay | Admitting: Interventional Cardiology

## 2018-02-12 ENCOUNTER — Encounter (HOSPITAL_COMMUNITY)
Admission: RE | Disposition: A | Discharge status: Home / Self Care | Payer: Self-pay | Source: Ambulatory Visit | Attending: Interventional Cardiology

## 2018-02-12 DIAGNOSIS — J449 Chronic obstructive pulmonary disease, unspecified: Secondary | ICD-10-CM | POA: Diagnosis not present

## 2018-02-12 DIAGNOSIS — Z7982 Long term (current) use of aspirin: Secondary | ICD-10-CM | POA: Diagnosis not present

## 2018-02-12 DIAGNOSIS — E079 Disorder of thyroid, unspecified: Secondary | ICD-10-CM | POA: Diagnosis not present

## 2018-02-12 DIAGNOSIS — E781 Pure hyperglyceridemia: Secondary | ICD-10-CM | POA: Diagnosis not present

## 2018-02-12 DIAGNOSIS — I739 Peripheral vascular disease, unspecified: Secondary | ICD-10-CM

## 2018-02-12 DIAGNOSIS — I200 Unstable angina: Secondary | ICD-10-CM

## 2018-02-12 DIAGNOSIS — I252 Old myocardial infarction: Secondary | ICD-10-CM | POA: Insufficient documentation

## 2018-02-12 DIAGNOSIS — I25118 Atherosclerotic heart disease of native coronary artery with other forms of angina pectoris: Secondary | ICD-10-CM | POA: Diagnosis not present

## 2018-02-12 DIAGNOSIS — Z955 Presence of coronary angioplasty implant and graft: Secondary | ICD-10-CM | POA: Diagnosis not present

## 2018-02-12 DIAGNOSIS — F17210 Nicotine dependence, cigarettes, uncomplicated: Secondary | ICD-10-CM | POA: Diagnosis not present

## 2018-02-12 DIAGNOSIS — I10 Essential (primary) hypertension: Secondary | ICD-10-CM | POA: Diagnosis not present

## 2018-02-12 HISTORY — PX: LEFT HEART CATH AND CORONARY ANGIOGRAPHY: CATH118249

## 2018-02-12 SURGERY — LEFT HEART CATH AND CORONARY ANGIOGRAPHY
Anesthesia: LOCAL

## 2018-02-12 MED ORDER — LIDOCAINE HCL (PF) 1 % IJ SOLN
INTRAMUSCULAR | Status: AC
  Filled 2018-02-12: qty 30

## 2018-02-12 MED ORDER — MIDAZOLAM HCL 2 MG/2ML IJ SOLN
INTRAMUSCULAR | Status: AC
  Filled 2018-02-12: qty 2

## 2018-02-12 MED ORDER — HEPARIN (PORCINE) IN NACL 2-0.9 UNIT/ML-% IJ SOLN
INTRAMUSCULAR | Status: AC
  Filled 2018-02-12: qty 1000

## 2018-02-12 MED ORDER — SODIUM CHLORIDE 0.9% FLUSH: 3.0000 mL | INTRAVENOUS | Status: DC | PRN

## 2018-02-12 MED ORDER — FENTANYL CITRATE (PF) 100 MCG/2ML IJ SOLN
INTRAMUSCULAR | Status: DC | PRN
  Administered 2018-02-12: 25 ug via INTRAVENOUS

## 2018-02-12 MED ORDER — IOPAMIDOL (ISOVUE-370) INJECTION 76%
INTRAVENOUS | Status: AC
  Filled 2018-02-12: qty 100

## 2018-02-12 MED ORDER — SODIUM CHLORIDE 0.9 % IV SOLN: 250.0000 mL | INTRAVENOUS | Status: DC | PRN

## 2018-02-12 MED ORDER — VERAPAMIL HCL 2.5 MG/ML IV SOLN
INTRAVENOUS | Status: DC | PRN
  Administered 2018-02-12: 08:00:00 via INTRA_ARTERIAL

## 2018-02-12 MED ORDER — HEPARIN (PORCINE) IN NACL 2-0.9 UNIT/ML-% IJ SOLN
INTRAMUSCULAR | Status: AC | PRN
  Administered 2018-02-12 (×2): 500 mL

## 2018-02-12 MED ORDER — ASPIRIN 81 MG PO CHEW: 81.0000 mg | CHEWABLE_TABLET | ORAL | Status: DC

## 2018-02-12 MED ORDER — FENTANYL CITRATE (PF) 100 MCG/2ML IJ SOLN
INTRAMUSCULAR | Status: AC
  Filled 2018-02-12: qty 2

## 2018-02-12 MED ORDER — LIDOCAINE HCL (PF) 1 % IJ SOLN
INTRAMUSCULAR | Status: DC | PRN
  Administered 2018-02-12: 2 mL

## 2018-02-12 MED ORDER — HEPARIN SODIUM (PORCINE) 1000 UNIT/ML IJ SOLN
INTRAMUSCULAR | Status: DC | PRN
  Administered 2018-02-12: 5000 [IU] via INTRAVENOUS

## 2018-02-12 MED ORDER — SODIUM CHLORIDE 0.9 % WEIGHT BASED INFUSION
1.0000 mL/kg/h | INTRAVENOUS | Status: DC
  Administered 2018-02-12: 250 mL via INTRAVENOUS

## 2018-02-12 MED ORDER — IOPAMIDOL (ISOVUE-370) INJECTION 76%
INTRAVENOUS | Status: DC | PRN
  Administered 2018-02-12: 55 mL via INTRA_ARTERIAL

## 2018-02-12 MED ORDER — MIDAZOLAM HCL 2 MG/2ML IJ SOLN
INTRAMUSCULAR | Status: DC | PRN
  Administered 2018-02-12: 2 mg via INTRAVENOUS

## 2018-02-12 MED ORDER — HEPARIN SODIUM (PORCINE) 1000 UNIT/ML IJ SOLN
INTRAMUSCULAR | Status: AC
  Filled 2018-02-12: qty 1

## 2018-02-12 MED ORDER — SODIUM CHLORIDE 0.9% FLUSH: 3.0000 mL | Freq: Two times a day (BID) | INTRAVENOUS | Status: DC

## 2018-02-12 MED ORDER — VERAPAMIL HCL 2.5 MG/ML IV SOLN
INTRAVENOUS | Status: AC
  Filled 2018-02-12: qty 2

## 2018-02-12 MED ORDER — SODIUM CHLORIDE 0.9 % WEIGHT BASED INFUSION
3.0000 mL/kg/h | INTRAVENOUS | Status: AC
  Administered 2018-02-12: 3 mL/kg/h via INTRAVENOUS

## 2018-02-12 MED ORDER — SODIUM CHLORIDE 0.9 % IV SOLN: INTRAVENOUS | Status: DC

## 2018-02-12 SURGICAL SUPPLY — 9 items
CATH IMPULSE 5F ANG/FL3.5 (CATHETERS) ×2 IMPLANT
DEVICE RAD COMP TR BAND LRG (VASCULAR PRODUCTS) ×2 IMPLANT
GLIDESHEATH SLEND SS 6F .021 (SHEATH) ×2 IMPLANT
GUIDEWIRE INQWIRE 1.5J.035X260 (WIRE) ×1 IMPLANT
INQWIRE 1.5J .035X260CM (WIRE) ×2
KIT HEART LEFT (KITS) ×2 IMPLANT
PACK CARDIAC CATHETERIZATION (CUSTOM PROCEDURE TRAY) ×2 IMPLANT
TRANSDUCER W/STOPCOCK (MISCELLANEOUS) ×2 IMPLANT
TUBING CIL FLEX 10 FLL-RA (TUBING) ×2 IMPLANT

## 2018-02-12 NOTE — Telephone Encounter (Signed)
Advised patient cath showed chest pains not coming from blockages so that is good new. Did get his ABI's moved up to next week, were scheduled in June. He would like to know if Dr Lamount Crankeranoldph has any further recommendations. Will forward for review

## 2018-02-12 NOTE — Telephone Encounter (Signed)
Mrs. Kenneth Maddox is calling because Mr. Kenneth Maddox had a cath today and he is still having chest pains , not sure if its indigestion or something with his stomach. He was supposed to have an ABI and he didn't have one . Please call

## 2018-02-12 NOTE — Discharge Instructions (Signed)

## 2018-02-12 NOTE — Interval H&P Note (Signed)
Cath Lab Visit (complete for each Cath Lab visit)  Clinical Evaluation Leading to the Procedure:   ACS: No.  Non-ACS:    Anginal Classification: CCS III  Anti-ischemic medical therapy: Minimal Therapy (1 class of medications)  Non-Invasive Test Results: No non-invasive testing performed  Prior CABG: No previous CABG      History and Physical Interval Note:  02/12/2018 7:28 AM  Kenneth Maddox  has presented today for surgery, with the diagnosis of angina, cad  The various methods of treatment have been discussed with the patient and family. After consideration of risks, benefits and other options for treatment, the patient has consented to  Procedure(s): LEFT HEART CATH AND CORONARY ANGIOGRAPHY (N/A) as a surgical intervention .  The patient's history has been reviewed, patient examined, no change in status, stable for surgery.  I have reviewed the patient's chart and labs.  Questions were answered to the patient's satisfaction.     Lance MussJayadeep Quavion Boule

## 2018-02-13 MED ORDER — PANTOPRAZOLE SODIUM 40 MG PO TBEC: 40.0000 mg | DELAYED_RELEASE_TABLET | Freq: Every day | ORAL | 1 refills | Status: DC

## 2018-02-13 MED FILL — Heparin Sodium (Porcine) 2 Unit/ML in Sodium Chloride 0.9%: INTRAMUSCULAR | Qty: 1000 | Status: AC

## 2018-02-13 NOTE — Telephone Encounter (Signed)
Advised patient, verbalized understanding  

## 2018-02-13 NOTE — Telephone Encounter (Signed)
Please order protonix 40mg  daily.  If it is GERD this will help.

## 2018-02-16 ENCOUNTER — Telehealth: Payer: Self-pay | Admitting: *Deleted

## 2018-02-16 DIAGNOSIS — E785 Hyperlipidemia, unspecified: Secondary | ICD-10-CM

## 2018-02-16 DIAGNOSIS — E782 Mixed hyperlipidemia: Secondary | ICD-10-CM

## 2018-02-16 MED ORDER — ICOSAPENT ETHYL 1 G PO CAPS: 2.0000 g | ORAL_CAPSULE | Freq: Two times a day (BID) | ORAL | 5 refills | Status: DC

## 2018-02-16 NOTE — Telephone Encounter (Signed)
Advised patient of lab results  

## 2018-02-16 NOTE — Telephone Encounter (Signed)
-----   Message from Chilton Siiffany [place], MD sent at 02/12/2018  5:45 PM EST ----- Kidney function, liver function, and electrolytes are normal.  Triglycerides are still high but bad cholesterol is very good.  Recommend adding a high potency fish oil called Vascepa 4g daily instead of his fish oil.  We may have samples or copay cards.  Repeat lipids in 3 months.

## 2018-02-16 NOTE — Addendum Note (Signed)
Addended by: Regis BillPRATT, MELINDA B on: 02/16/2018 05:58 PM   Modules accepted: Orders

## 2018-02-20 ENCOUNTER — Ambulatory Visit (HOSPITAL_COMMUNITY)
Admission: RE | Admit: 2018-02-20 | Discharge: 2018-02-20 | Disposition: A | Discharge status: Home / Self Care | Payer: Managed Care, Other (non HMO) | Source: Ambulatory Visit | Attending: Internal Medicine | Admitting: Internal Medicine

## 2018-02-20 DIAGNOSIS — I739 Peripheral vascular disease, unspecified: Secondary | ICD-10-CM

## 2018-03-02 ENCOUNTER — Encounter: Payer: Self-pay | Admitting: *Deleted

## 2018-03-10 ENCOUNTER — Telehealth: Payer: Self-pay | Admitting: *Deleted

## 2018-03-10 MED ORDER — PANTOPRAZOLE SODIUM 40 MG PO TBEC: 40.0000 mg | DELAYED_RELEASE_TABLET | Freq: Every day | ORAL | 1 refills | Status: DC

## 2018-03-10 NOTE — Telephone Encounter (Signed)
Rx for Protonix sent to mail order as requested

## 2018-03-23 ENCOUNTER — Telehealth: Payer: Self-pay | Admitting: *Deleted

## 2018-03-23 MED ORDER — ICOSAPENT ETHYL 1 G PO CAPS: 2.0000 g | ORAL_CAPSULE | Freq: Two times a day (BID) | ORAL | 3 refills | Status: DC

## 2018-03-23 NOTE — Telephone Encounter (Signed)
Vascepa required PA, obtained from [employer] via Cover my meds  Your request has been approved  CaseId:[id number];Status:Approved;Review Type:Prior Auth;Coverage Start Date:02/21/2018;Coverage End Date:03/22/2021;  Advised wife and sent Rx to mail order as she requested

## 2018-04-24 ENCOUNTER — Telehealth: Payer: Self-pay | Admitting: *Deleted

## 2018-04-24 NOTE — Telephone Encounter (Signed)
Spoke with patients wife and patient has had decreased energy since starting the Vascepa. Discussed with Roxine Caddy and this is a side effect in a small number of patients so have him hold for 1 week and let and let us know how he is doing. Advised wife

## 2018-05-18 ENCOUNTER — Encounter (HOSPITAL_COMMUNITY): Payer: Managed Care, Other (non HMO)

## 2018-05-25 ENCOUNTER — Ambulatory Visit: Payer: Managed Care, Other (non HMO) | Admitting: Cardiovascular Disease

## 2018-07-29 ENCOUNTER — Telehealth: Payer: Self-pay | Admitting: *Deleted

## 2018-07-29 MED ORDER — NITROGLYCERIN 0.4 MG SL SUBL: 0.4000 mg | SUBLINGUAL_TABLET | SUBLINGUAL | 5 refills | Status: DC | PRN

## 2018-07-29 NOTE — Telephone Encounter (Signed)
Patients wife requested refill of NTG #25, 1 bottle be sent to mail order. Sent as requested

## 2018-08-19 ENCOUNTER — Other Ambulatory Visit: Payer: Self-pay | Admitting: Cardiovascular Disease

## 2019-04-05 ENCOUNTER — Other Ambulatory Visit: Payer: Self-pay | Admitting: Cardiovascular Disease

## 2019-07-04 ENCOUNTER — Other Ambulatory Visit: Payer: Self-pay | Admitting: Cardiovascular Disease

## 2020-11-07 ENCOUNTER — Telehealth: Payer: Self-pay | Admitting: Cardiovascular Disease

## 2020-11-07 DIAGNOSIS — I10 Essential (primary) hypertension: Secondary | ICD-10-CM

## 2020-11-07 DIAGNOSIS — Z955 Presence of coronary angioplasty implant and graft: Secondary | ICD-10-CM

## 2020-11-07 DIAGNOSIS — Z0289 Encounter for other administrative examinations: Secondary | ICD-10-CM

## 2020-11-07 NOTE — Telephone Encounter (Signed)
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 Please assist.

## 2020-11-07 NOTE — Telephone Encounter (Signed)
Left message to call back  

## 2020-11-09 ENCOUNTER — Telehealth: Payer: Self-pay | Admitting: Cardiovascular Disease

## 2020-11-09 NOTE — Telephone Encounter (Addendum)
Discussed with Dr Duke Salvia and ok for patient to get Echo but he needs follow up   [facility] to follow up in [place] and have Echo there if closer to patient  Discussed with wife and confirmed need per employer  Messages sent to scheduling to arrange

## 2020-11-09 NOTE — Telephone Encounter (Signed)
Left message for patient to call and schedule Echo in our [place] office and folow up with Dr. Duke Salvia or in the [place] office (whichever the patient prfers)

## 2020-11-09 NOTE — Addendum Note (Signed)
Addended by: Regis Bill B on: 11/09/2020 01:31 PM   Modules accepted: Orders

## 2020-11-14 ENCOUNTER — Ambulatory Visit (HOSPITAL_COMMUNITY)
Admission: RE | Admit: 2020-11-14 | Discharge: 2020-11-14 | Disposition: A | Discharge status: Home / Self Care | Payer: BC Managed Care – PPO | Source: Ambulatory Visit | Attending: Cardiovascular Disease | Admitting: Cardiovascular Disease

## 2020-11-14 ENCOUNTER — Telehealth: Payer: Self-pay | Admitting: Cardiovascular Disease

## 2020-11-14 ENCOUNTER — Other Ambulatory Visit: Payer: Self-pay

## 2020-11-14 DIAGNOSIS — I10 Essential (primary) hypertension: Secondary | ICD-10-CM | POA: Insufficient documentation

## 2020-11-14 DIAGNOSIS — Z955 Presence of coronary angioplasty implant and graft: Secondary | ICD-10-CM | POA: Diagnosis present

## 2020-11-14 LAB — ECHOCARDIOGRAM COMPLETE
Area-P 1/2: 2 cm2
S' Lateral: 2.8 cm

## 2020-11-14 NOTE — Telephone Encounter (Signed)
Patient wanted to know if Dr. Duke Salvia would write him a letter to return to work. The patient had an Echocardiogram at [facility] this morning.  Please let the patient know when the letter is ready.

## 2020-11-14 NOTE — Progress Notes (Signed)
*  PRELIMINARY RESULTS* Echocardiogram 2D Echocardiogram has been performed.  Stacey Drain 11/14/2020, 9:19 AM

## 2020-11-14 NOTE — Telephone Encounter (Signed)
Spoke with patient. 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 He has an appointment on 12/20 but he is hoping to get the note sooner as they wanted him to start 2 weeks ago and he doesn't want to miss this opportunity.   Will route to Dr. Duke Salvia and primary nurse for review and work note if able.

## 2020-11-15 NOTE — Telephone Encounter (Signed)
Patient called back and wanted to follow up on the status of the letter. Please advise

## 2020-11-15 NOTE — Telephone Encounter (Signed)
Spoke with patient and advised Dr Duke Salvia would not be able to clear him without an in office visit Also explained that Dr Duke Salvia was ok for patient to see someone in [place] since it is closer Will send message to schedulers to see if any available appointments soon in [place]  Per patient if he does not get cleared he will not get his job

## 2020-11-15 NOTE — Telephone Encounter (Signed)
Patient called again to check on the status of his letter.

## 2020-11-16 NOTE — Telephone Encounter (Signed)
Left message for patient to call and discuss rescheduling his virtual visit scheduled 11/27/20 with Dr. Duke Salvia

## 2020-11-20 NOTE — Telephone Encounter (Signed)
Left message for patient to call and reschedule virtual visit on 11/27/20 with Dr. Duke Salvia to an in office visit

## 2020-11-24 NOTE — Telephone Encounter (Signed)
Spoke with patient and offered him in office visit for Monday, he will be out of town Patient stated he would call back for appointment

## 2020-11-27 ENCOUNTER — Telehealth: Payer: BC Managed Care – PPO | Admitting: Cardiovascular Disease

## 2022-04-24 ENCOUNTER — Encounter: Payer: Self-pay | Admitting: Family Medicine

## 2022-04-24 ENCOUNTER — Ambulatory Visit (INDEPENDENT_AMBULATORY_CARE_PROVIDER_SITE_OTHER): Payer: BC Managed Care – PPO

## 2022-04-24 ENCOUNTER — Ambulatory Visit: Payer: BC Managed Care – PPO | Admitting: Family Medicine

## 2022-04-24 VITALS — BP 134/83 | HR 68 | Temp 97.9°F | Ht 75.0 in | Wt 210.8 lb

## 2022-04-24 DIAGNOSIS — G4452 New daily persistent headache (NDPH): Secondary | ICD-10-CM

## 2022-04-24 MED ORDER — SPIRIVA RESPIMAT 2.5 MCG/ACT IN AERS: 2.0000 | INHALATION_SPRAY | Freq: Every day | RESPIRATORY_TRACT | 11 refills | Status: DC

## 2022-04-24 MED ORDER — PREDNISONE 20 MG PO TABS: ORAL_TABLET | ORAL | 0 refills | Status: DC

## 2022-04-24 NOTE — Progress Notes (Signed)
? ?Subjective:  ?Patient ID: Kenneth Maddox, male    DOB: [birth year]  Age: 62 y.o. MRN: [id number] ? ?CC: New Patient (Initial Visit) ? ? ?HPI ?Kenneth Maddox presents for headaches. Getting pretty bad over last two months. Pain 7-8/10. Cough makes it feel like it will explode. Ibuprofen not helping. Intermittent pain made far worse by cough.Occurring daily. ? ? Pt. Has a chronic cough from COPD. Has declined inhalers in the pastbut reports having days where Kenneth Maddox has trouble walking from the back of the [place] parking lot to the store as a result of dyspnea.  ? ?Doesn't affect vision. Used to have migraines. This is nothing like the migraines were.HA now is posterior, occipital. PRessure like head will explode. No trauma. Denies focal neuro deficits.  ? ?Migraines went away in 199 after an ear surgery. 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 The surgery was an unsuccessful attempt to restore hearing.  ? ? ?  04/24/2022  ? 11:17 AM  ?Depression screen PHQ 2/9  ?Decreased Interest 0  ?Down, Depressed, Hopeless 0  ?PHQ - 2 Score 0  ? ? ?History ?Kenneth Maddox has a past medical history of Complication of anesthesia, COPD (chronic obstructive pulmonary disease) (HCC), Coronary artery disease, Hyperlipidemia LDL goal < 70, Hypertension, Hypertriglyceridemia (01/27/2016), Myocardial infarction (HCC), PONV (postoperative nausea and vomiting), Presence of stent in left circumflex coronary artery (06/2010), and Thyroid disease.  ? ?Kenneth Maddox has a past surgical history that includes Coronary stent placement; External ear surgery; left heart catheterization with coronary angiogram (N/A, 02/26/2012); laparoscopic appendectomy (N/A, 11/16/2015); Appendectomy; and LEFT HEART CATH AND CORONARY ANGIOGRAPHY (N/A, 02/12/2018).  ? ?His family history includes Diabetes in his daughter and father; Leukemia in his mother.Kenneth Maddox reports that Kenneth Maddox has been smoking cigarettes. Kenneth Maddox has a 40.00 pack-year smoking history. Kenneth Maddox has never used smokeless tobacco. Kenneth Maddox  reports that Kenneth Maddox does not drink alcohol and does not use drugs. ? ? ? ?ROS ?Review of Systems  ?Constitutional: Negative.   ?HENT: Negative.    ?Eyes:  Negative for visual disturbance.  ?Respiratory:  Positive for cough and shortness of breath.   ?Cardiovascular:  Negative for chest pain and leg swelling.  ?Gastrointestinal:  Negative for abdominal pain, diarrhea, nausea and vomiting.  ?Genitourinary:  Negative for difficulty urinating.  ?Musculoskeletal:  Negative for arthralgias and myalgias.  ?Skin:  Negative for rash.  ?Neurological:  Positive for headaches. Negative for dizziness, syncope, facial asymmetry and weakness.  ?Psychiatric/Behavioral:  Negative for sleep disturbance.   ? ?Objective:  ?BP 134/83   Pulse 68   Temp 97.9 ?F (36.6 ?C)   Ht 6\' 3"  (1.905 m)   Wt 210 lb 12.8 oz (95.6 kg)   SpO2 96%   BMI 26.35 kg/m?  ? ?BP Readings from Last 3 Encounters:  ?04/24/22 134/83  ?02/12/18 120/75  ?02/06/18 122/74  ? ? ?Wt Readings from Last 3 Encounters:  ?04/24/22 210 lb 12.8 oz (95.6 kg)  ?02/12/18 221 lb (100.2 kg)  ?02/06/18 221 lb (100.2 kg)  ? ? ? ?Physical Exam ?Constitutional:   ?   General: Kenneth Maddox is not in acute distress. ?   Appearance: Kenneth Maddox is well-developed.  ?HENT:  ?   Head: Normocephalic and atraumatic.  ?   Right Ear: External ear normal.  ?   Left Ear: External ear normal.  ?   Nose: Nose normal.  ?Eyes:  ?   Conjunctiva/sclera: Conjunctivae normal.  ?   Pupils: Pupils are equal, round, and reactive to light.  ?  Cardiovascular:  ?   Rate and Rhythm: Normal rate and regular rhythm.  ?   Heart sounds: Normal heart sounds. No murmur heard. ?Pulmonary:  ?   Effort: Pulmonary effort is normal. No respiratory distress.  ?   Breath sounds: Normal breath sounds. No wheezing or rales.  ?Abdominal:  ?   Palpations: Abdomen is soft.  ?   Tenderness: There is no abdominal tenderness.  ?Musculoskeletal:     ?   General: Normal range of motion.  ?   Cervical back: Normal range of motion and neck supple. No  tenderness.  ?Skin: ?   General: Skin is warm and dry.  ?Neurological:  ?   Mental Status: Kenneth Maddox is alert and oriented to person, place, and time.  ?   Deep Tendon Reflexes: Reflexes are normal and symmetric.  ?Psychiatric:     ?   Behavior: Behavior normal.     ?   Thought Content: Thought content normal.     ?   Judgment: Judgment normal.  ? ? ? ? ?Assessment & Plan:  ? ?Kenneth Maddox was seen today for new patient (initial visit). ? ?Diagnoses and all orders for this visit: ? ?New daily persistent headache ?-     DG Chest 2 View; Future ?-     DG Cervical Spine Complete; Future ?-     MR BRAIN W WO CONTRAST; Future ? ?Other orders ?-     predniSONE (DELTASONE) 20 MG tablet; One twice daily with food for 2 weeks. Then one daily for 2 weeks ?-     Tiotropium Bromide Monohydrate (SPIRIVA RESPIMAT) 2.5 MCG/ACT AERS; Inhale 2 puffs into the lungs daily. ? ? ? ? ? ? ?I have discontinued Kenneth Maddox's aspirin EC, icosapent Ethyl, nitroGLYCERIN, pantoprazole, and atorvastatin. I am also having him start on predniSONE and Spiriva Respimat. ? ?Allergies as of 04/24/2022   ?No Known Allergies ?  ? ?  ?Medication List  ?  ? ?  ? Accurate as of Apr 24, 2022 12:34 PM. If you have any questions, ask your nurse or doctor.  ?  ?  ? ?  ? ?STOP taking these medications   ? ?aspirin EC 81 MG tablet ?Stopped by: Mechele Claude, MD ?  ?atorvastatin 80 MG tablet ?Commonly known as: LIPITOR ?Stopped by: Mechele Claude, MD ?  ?icosapent Ethyl 1 g capsule ?Commonly known as: Vascepa ?Stopped by: Mechele Claude, MD ?  ?nitroGLYCERIN 0.4 MG SL tablet ?Commonly known as: NITROSTAT ?Stopped by: Mechele Claude, MD ?  ?pantoprazole 40 MG tablet ?Commonly known as: PROTONIX ?Stopped by: Mechele Claude, MD ?  ? ?  ? ?TAKE these medications   ? ?predniSONE 20 MG tablet ?Commonly known as: DELTASONE ?One twice daily with food for 2 weeks. Then one daily for 2 weeks ?Started by: Mechele Claude, MD ?  ?Spiriva Respimat 2.5 MCG/ACT Aers ?Generic drug: Tiotropium  Bromide Monohydrate ?Inhale 2 puffs into the lungs daily. ?Started by: Mechele Claude, MD ?  ? ?  ? ? ? ?Follow-up: Return in about 1 month (around 05/25/2022). ? ?Mechele Claude, M.D. ?

## 2022-04-29 ENCOUNTER — Encounter: Payer: Self-pay | Admitting: Family Medicine

## 2022-05-16 ENCOUNTER — Ambulatory Visit (HOSPITAL_COMMUNITY)
Admission: RE | Admit: 2022-05-16 | Discharge: 2022-05-16 | Disposition: A | Discharge status: Home / Self Care | Payer: BC Managed Care – PPO | Source: Ambulatory Visit | Attending: Family Medicine | Admitting: Family Medicine

## 2022-05-16 DIAGNOSIS — G4452 New daily persistent headache (NDPH): Secondary | ICD-10-CM | POA: Diagnosis present

## 2022-05-16 MED ORDER — GADOBUTROL 1 MMOL/ML IV SOLN
10.0000 mL | Freq: Once | INTRAVENOUS | Status: AC | PRN
  Administered 2022-05-16: 10 mL via INTRAVENOUS

## 2022-05-21 ENCOUNTER — Encounter: Payer: Self-pay | Admitting: Family Medicine

## 2022-05-21 ENCOUNTER — Ambulatory Visit: Payer: BC Managed Care – PPO | Admitting: Family Medicine

## 2022-05-21 VITALS — BP 127/74 | HR 79 | Temp 97.9°F | Ht 75.0 in | Wt 212.4 lb

## 2022-05-21 DIAGNOSIS — F17200 Nicotine dependence, unspecified, uncomplicated: Secondary | ICD-10-CM | POA: Diagnosis not present

## 2022-05-21 DIAGNOSIS — J432 Centrilobular emphysema: Secondary | ICD-10-CM | POA: Diagnosis not present

## 2022-05-21 DIAGNOSIS — M5412 Radiculopathy, cervical region: Secondary | ICD-10-CM

## 2022-05-21 DIAGNOSIS — F17210 Nicotine dependence, cigarettes, uncomplicated: Secondary | ICD-10-CM | POA: Diagnosis not present

## 2022-05-21 MED ORDER — PREGABALIN 50 MG PO CAPS: ORAL_CAPSULE | ORAL | 0 refills | Status: DC

## 2022-05-21 NOTE — Progress Notes (Signed)
Subjective:  Patient ID: Kenneth Maddox, male    DOB: 1961/10/08  Age: 61 y.o. MRN: [id number]  CC: Results (MRI)   HPI Kenneth Maddox presents for Intractable HA. Early awakening. HA worst in AMs. 6-8/10. Intermittent. Pain is a little lighter.  Prednisone really did not make much difference for him.  We reviewed the MRI that showed some sinus congestion.  Kenneth Maddox does not have significant symptoms from that currently.  His previous x-ray showed significant cervical spinal stenosis and spondylosis.  Spiriva really helps with breathing. Smoking 1 ppd.      05/21/2022    1:50 PM 04/24/2022   11:17 AM  Depression screen PHQ 2/9  Decreased Interest 0 0  Down, Depressed, Hopeless 0 0  PHQ - 2 Score 0 0    History Kenneth Maddox has a past medical history of Complication of anesthesia, COPD (chronic obstructive pulmonary disease) ([place]), Coronary artery disease, Hyperlipidemia LDL goal < 70, Hypertension, Hypertriglyceridemia (01/27/2016), Myocardial infarction ([place]), PONV (postoperative nausea and vomiting), Presence of stent in left circumflex coronary artery (06/2010), and Thyroid disease.   Kenneth Maddox has a past surgical history that includes Coronary stent placement; External ear surgery; left heart catheterization with coronary angiogram (N/A, 02/26/2012); laparoscopic appendectomy (N/A, 11/16/2015); Appendectomy; and LEFT HEART CATH AND CORONARY ANGIOGRAPHY (N/A, 02/12/2018).   His family history includes Diabetes in his daughter and father; Leukemia in his mother.Kenneth Maddox reports that Kenneth Maddox has been smoking cigarettes. Kenneth Maddox has a 40.00 pack-year smoking history. Kenneth Maddox has never used smokeless tobacco. Kenneth Maddox reports that Kenneth Maddox does not drink alcohol and does not use drugs.    ROS Review of Systems  Constitutional:  Negative for fever.  Respiratory:  Negative for shortness of breath.   Cardiovascular:  Negative for chest pain.  Musculoskeletal:  Positive for myalgias (legs cramps daily, intermittently). Negative for arthralgias.   Skin:  Negative for rash.  Neurological:  Positive for headaches.    Objective:  BP 127/74   Pulse 79   Temp 97.9 F (36.6 C)   Ht 6' 3"  (1.905 m)   Wt 212 lb 6.4 oz (96.3 kg)   SpO2 96%   BMI 26.55 kg/m   BP Readings from Last 3 Encounters:  05/21/22 127/74  04/24/22 134/83  02/12/18 120/75    Wt Readings from Last 3 Encounters:  05/21/22 212 lb 6.4 oz (96.3 kg)  04/24/22 210 lb 12.8 oz (95.6 kg)  02/12/18 221 lb (100.2 kg)     Physical Exam Vitals reviewed.  Constitutional:      Appearance: Kenneth Maddox is well-developed.  HENT:     Head: Normocephalic and atraumatic.     Right Ear: External ear normal.     Left Ear: External ear normal.     Mouth/Throat:     Pharynx: No oropharyngeal exudate or posterior oropharyngeal erythema.  Eyes:     Pupils: Pupils are equal, round, and reactive to light.  Cardiovascular:     Rate and Rhythm: Normal rate and regular rhythm.     Heart sounds: No murmur heard. Pulmonary:     Effort: No respiratory distress.     Breath sounds: Normal breath sounds.  Musculoskeletal:     Cervical back: Normal range of motion and neck supple.  Neurological:     Mental Status: Kenneth Maddox is alert and oriented to person, place, and time.       Assessment & Plan:   Kenneth Maddox was seen today for results.  Diagnoses and all orders for this visit:  Cervical radiculopathy -  MR CERVICAL SPINE WO CONTRAST; Future -     pregabalin (LYRICA) 50 MG capsule; 1 qhs X7 days , then 2 qhs X 7d, then 3 qhs X 7d, then 4 qhs -     CMP14+EGFR -     CBC with Differential/Platelet  Tobacco dependence  Smokes with greater than 40 pack year history  Centrilobular emphysema (HCC)    We discussed smoking cessation at length.   I am having Claudean Severance start on pregabalin. I am also having him maintain his predniSONE and Spiriva Respimat.  Allergies as of 05/21/2022   No Known Allergies      Medication List        Accurate as of May 21, 2022  5:55 PM.  If you have any questions, ask your nurse or doctor.          predniSONE 20 MG tablet Commonly known as: DELTASONE One twice daily with food for 2 weeks. Then one daily for 2 weeks   pregabalin 50 MG capsule Commonly known as: Lyrica 1 qhs X7 days , then 2 qhs X 7d, then 3 qhs X 7d, then 4 qhs Started by: Claretta Fraise, MD   Spiriva Respimat 2.5 MCG/ACT Aers Generic drug: Tiotropium Bromide Monohydrate Inhale 2 puffs into the lungs daily.         Follow-up: Return in about 1 month (around 06/20/2022).  Claretta Fraise, M.D.

## 2022-05-22 LAB — CMP14+EGFR
ALT: 22 IU/L (ref 0–44)
AST: 17 IU/L (ref 0–40)
Albumin/Globulin Ratio: 1.9 (ref 1.2–2.2)
Albumin: 4.3 g/dL (ref 3.8–4.9)
Alkaline Phosphatase: 85 IU/L (ref 44–121)
BUN/Creatinine Ratio: 20 (ref 9–20)
BUN: 25 mg/dL — ABNORMAL HIGH (ref 6–24)
Bilirubin Total: 0.3 mg/dL (ref 0.0–1.2)
CO2: 23 mmol/L (ref 20–29)
Calcium: 9.5 mg/dL (ref 8.7–10.2)
Chloride: 102 mmol/L (ref 96–106)
Creatinine, Ser: 1.23 mg/dL (ref 0.76–1.27)
Globulin, Total: 2.3 g/dL (ref 1.5–4.5)
Glucose: 94 mg/dL (ref 70–99)
Potassium: 3.8 mmol/L (ref 3.5–5.2)
Sodium: 141 mmol/L (ref 134–144)
Total Protein: 6.6 g/dL (ref 6.0–8.5)
eGFR: 68 mL/min/{1.73_m2} (ref >59)

## 2022-05-22 LAB — CBC WITH DIFFERENTIAL/PLATELET
Basophils Absolute: 0 10*3/uL (ref 0.0–0.2)
Basos: 0 %
EOS (ABSOLUTE): 0.1 10*3/uL (ref 0.0–0.4)
Eos: 1 %
Hematocrit: 49 % (ref 37.5–51.0)
Hemoglobin: 16.6 g/dL (ref 13.0–17.7)
Immature Grans (Abs): 0 10*3/uL (ref 0.0–0.1)
Immature Granulocytes: 0 %
Lymphocytes Absolute: 3.5 10*3/uL — ABNORMAL HIGH (ref 0.7–3.1)
Lymphs: 35 %
MCH: 31.5 pg (ref 26.6–33.0)
MCHC: 33.9 g/dL (ref 31.5–35.7)
MCV: 93 fL (ref 79–97)
Monocytes Absolute: 0.5 10*3/uL (ref 0.1–0.9)
Monocytes: 5 %
Neutrophils Absolute: 5.6 10*3/uL (ref 1.4–7.0)
Neutrophils: 59 %
Platelets: 194 10*3/uL (ref 150–450)
RBC: 5.27 x10E6/uL (ref 4.14–5.80)
RDW: 14.4 % (ref 11.6–15.4)
WBC: 9.9 10*3/uL (ref 3.4–10.8)

## 2022-05-22 NOTE — Progress Notes (Signed)
Hello Jayion,  Your lab result is normal and/or stable.Some minor variations that are not significant are commonly marked abnormal, but do not represent any medical problem for you.  Best regards, Victorya Hillman, M.D.

## 2022-06-12 ENCOUNTER — Ambulatory Visit
Admission: RE | Admit: 2022-06-12 | Discharge: 2022-06-12 | Disposition: A | Discharge status: Home / Self Care | Payer: BC Managed Care – PPO | Source: Ambulatory Visit | Attending: Family Medicine | Admitting: Family Medicine

## 2022-06-12 DIAGNOSIS — M5412 Radiculopathy, cervical region: Secondary | ICD-10-CM

## 2022-06-18 ENCOUNTER — Encounter: Payer: Self-pay | Admitting: Family Medicine

## 2022-06-18 ENCOUNTER — Ambulatory Visit: Payer: BC Managed Care – PPO | Admitting: Family Medicine

## 2022-06-18 VITALS — BP 126/68 | HR 77 | Temp 97.4°F | Ht 75.0 in | Wt 209.2 lb

## 2022-06-18 DIAGNOSIS — G4452 New daily persistent headache (NDPH): Secondary | ICD-10-CM

## 2022-06-18 DIAGNOSIS — M5412 Radiculopathy, cervical region: Secondary | ICD-10-CM | POA: Diagnosis not present

## 2022-06-18 DIAGNOSIS — M5416 Radiculopathy, lumbar region: Secondary | ICD-10-CM

## 2022-06-18 MED ORDER — PREGABALIN 50 MG PO CAPS: ORAL_CAPSULE | ORAL | 0 refills | Status: DC

## 2022-06-18 MED ORDER — DICLOFENAC SODIUM 75 MG PO TBEC: 75.0000 mg | DELAYED_RELEASE_TABLET | Freq: Two times a day (BID) | ORAL | 2 refills | Status: DC

## 2022-06-18 NOTE — Progress Notes (Signed)
Subjective:  Patient ID: Kenneth Maddox, male    DOB: 29-Aug-1961  Age: 61 y.o. MRN: [id number]  CC: Follow-up and Headache   HPI [name] presents for  increasing headache. Had MRI of neck last week. Shows multiple deformities, possible herniated disc. Pain is 4-6 at baseline with flares to 8-9/10. Lyrica caused too much drowsiness. Couldn't tolerate two pills dose. HA continues daily. Having to work. Worse with standing still.  Also pain in lower back radiatiing to the right leg.     06/18/2022    2:27 PM 06/18/2022    2:21 PM 05/21/2022    1:50 PM  Depression screen PHQ 2/9  Decreased Interest 0 0 0  Down, Depressed, Hopeless 0 0 0  PHQ - 2 Score 0 0 0  Altered sleeping 3    Tired, decreased energy 3    Change in appetite 0    Feeling bad or failure about yourself  0    Trouble concentrating 0    Moving slowly or fidgety/restless 0    Suicidal thoughts 0    PHQ-9 Score 6    Difficult doing work/chores Very difficult      History [name] has a past medical history of Complication of anesthesia, COPD (chronic obstructive pulmonary disease) (HCC), Coronary artery disease, Hyperlipidemia LDL goal < 70, Hypertension, Hypertriglyceridemia (01/27/2016), Myocardial infarction (HCC), PONV (postoperative nausea and vomiting), Presence of stent in left circumflex coronary artery (06/2010), and Thyroid disease.   He has a past surgical history that includes Coronary stent placement; External ear surgery; left heart catheterization with coronary angiogram (N/A, 02/26/2012); laparoscopic appendectomy (N/A, 11/16/2015); Appendectomy; and LEFT HEART CATH AND CORONARY ANGIOGRAPHY (N/A, 02/12/2018).   His family history includes Diabetes in his daughter and father; Leukemia in his mother.He reports that he has been smoking cigarettes. He has a 40.00 pack-year smoking history. He has never used smokeless tobacco. He reports that he does not drink alcohol and does not use drugs.    ROS Review of  Systems  Constitutional:  Negative for fever.  Respiratory:  Negative for shortness of breath.   Cardiovascular:  Negative for chest pain.  Musculoskeletal:  Positive for arthralgias, back pain (radiates down right leg) and neck pain.  Skin:  Negative for rash.  Neurological:  Positive for headaches.    Objective:  BP 126/68   Pulse 77   Temp (!) 97.4 F (36.3 C)   Ht 6\' 3"  (1.905 m)   Wt 209 lb 3.2 oz (94.9 kg)   SpO2 96%   BMI 26.15 kg/m   BP Readings from Last 3 Encounters:  06/18/22 126/68  05/21/22 127/74  04/24/22 134/83    Wt Readings from Last 3 Encounters:  06/18/22 209 lb 3.2 oz (94.9 kg)  05/21/22 212 lb 6.4 oz (96.3 kg)  04/24/22 210 lb 12.8 oz (95.6 kg)     Physical Exam Vitals reviewed.  Constitutional:      Appearance: He is well-developed.  HENT:     Head: Normocephalic and atraumatic.     Right Ear: External ear normal.     Left Ear: External ear normal.     Mouth/Throat:     Pharynx: No oropharyngeal exudate or posterior oropharyngeal erythema.  Eyes:     Pupils: Pupils are equal, round, and reactive to light.  Cardiovascular:     Rate and Rhythm: Normal rate and regular rhythm.     Heart sounds: No murmur heard. Pulmonary:     Effort: No respiratory distress.  Breath sounds: Normal breath sounds.  Musculoskeletal:     Cervical back: Normal range of motion and neck supple.  Neurological:     Mental Status: He is alert and oriented to person, place, and time.       Assessment & Plan:   Rocio was seen today for follow-up and headache.  Diagnoses and all orders for this visit:  New daily persistent headache  Cervical radiculopathy -     Ambulatory referral to Neurosurgery -     pregabalin (LYRICA) 50 MG capsule; 1 qhs X14 days , then 2 qhs  Lumbar radiculopathy  Other orders -     diclofenac (VOLTAREN) 75 MG EC tablet; Take 1 tablet (75 mg total) by mouth 2 (two) times daily. For neck pain and headache       I have  discontinued [name]'s predniSONE. I have also changed his pregabalin. Additionally, I am having him start on diclofenac. Lastly, I am having him maintain his Spiriva Respimat.  Allergies as of 06/18/2022   No Known Allergies      Medication List        Accurate as of June 18, 2022  3:00 PM. If you have any questions, ask your nurse or doctor.          STOP taking these medications    predniSONE 20 MG tablet Commonly known as: DELTASONE Stopped by: Mechele Claude, MD       TAKE these medications    diclofenac 75 MG EC tablet Commonly known as: VOLTAREN Take 1 tablet (75 mg total) by mouth 2 (two) times daily. For neck pain and headache Started by: Mechele Claude, MD   pregabalin 50 MG capsule Commonly known as: Lyrica 1 qhs X14 days , then 2 qhs What changed: additional instructions Changed by: Mechele Claude, MD   Spiriva Respimat 2.5 MCG/ACT Aers Generic drug: Tiotropium Bromide Monohydrate Inhale 2 puffs into the lungs daily.         Follow-up: Return in about 1 month (around 07/19/2022).  Mechele Claude, M.D.

## 2022-06-26 ENCOUNTER — Telehealth: Payer: Self-pay | Admitting: Family Medicine

## 2022-06-27 NOTE — Telephone Encounter (Signed)
Wife calling to check on status of referral.

## 2022-07-25 ENCOUNTER — Encounter: Payer: Self-pay | Admitting: Family Medicine

## 2022-07-25 ENCOUNTER — Ambulatory Visit: Payer: BC Managed Care – PPO | Admitting: Family Medicine

## 2022-07-25 DIAGNOSIS — M5412 Radiculopathy, cervical region: Secondary | ICD-10-CM | POA: Diagnosis not present

## 2022-07-25 MED ORDER — STIOLTO RESPIMAT 2.5-2.5 MCG/ACT IN AERS: 2.0000 | INHALATION_SPRAY | Freq: Every day | RESPIRATORY_TRACT | 3 refills | Status: DC

## 2022-07-25 MED ORDER — ALBUTEROL SULFATE HFA 108 (90 BASE) MCG/ACT IN AERS: 2.0000 | INHALATION_SPRAY | Freq: Four times a day (QID) | RESPIRATORY_TRACT | 11 refills | Status: AC | PRN

## 2022-07-25 MED ORDER — FLUTICASONE FUROATE-VILANTEROL 100-25 MCG/ACT IN AEPB: 1.0000 | INHALATION_SPRAY | Freq: Every day | RESPIRATORY_TRACT | 2 refills | Status: DC

## 2022-07-25 MED ORDER — PREGABALIN 50 MG PO CAPS: 150.0000 mg | ORAL_CAPSULE | Freq: Every day | ORAL | 0 refills | Status: DC

## 2022-07-25 NOTE — Progress Notes (Signed)
Subjective:  Patient ID: Kenneth Maddox, male    DOB: 1961-05-31  Age: 61 y.o. MRN: [id number]  CC: Follow-up   HPI Kenneth Maddox presents for head still banging around from HA.Getting worse. Pressure in posterior head & neck. Pounding currently. Pain can reach 8/10. Some relief, but inadequate with xurrent regimen  Spiriva was $500 couldn't get it this month. Has some days with DOE with everywhere he walks.      07/25/2022   10:49 AM 06/18/2022    2:27 PM 06/18/2022    2:21 PM  Depression screen PHQ 2/9  Decreased Interest 0 0 0  Down, Depressed, Hopeless 0 0 0  PHQ - 2 Score 0 0 0  Altered sleeping 1 3   Tired, decreased energy 3 3   Change in appetite 0 0   Feeling bad or failure about yourself  0 0   Trouble concentrating 0 0   Moving slowly or fidgety/restless 0 0   Suicidal thoughts 0 0   PHQ-9 Score 4 6   Difficult doing work/chores Somewhat difficult Very difficult     History Kenneth Maddox has a past medical history of Complication of anesthesia, COPD (chronic obstructive pulmonary disease) (HCC), Coronary artery disease, Hyperlipidemia LDL goal < 70, Hypertension, Hypertriglyceridemia (01/27/2016), Myocardial infarction (HCC), PONV (postoperative nausea and vomiting), Presence of stent in left circumflex coronary artery (06/2010), and Thyroid disease.   He has a past surgical history that includes Coronary stent placement; External ear surgery; left heart catheterization with coronary angiogram (N/A, 02/26/2012); laparoscopic appendectomy (N/A, 11/16/2015); Appendectomy; and LEFT HEART CATH AND CORONARY ANGIOGRAPHY (N/A, 02/12/2018).   His family history includes Diabetes in his daughter and father; Leukemia in his mother.He reports that he has been smoking cigarettes. He has a 40.00 pack-year smoking history. He has never used smokeless tobacco. He reports that he does not drink alcohol and does not use drugs.    ROS Review of Systems  Constitutional:  Negative for fever.   Respiratory:  Negative for shortness of breath.   Cardiovascular:  Negative for chest pain.  Musculoskeletal:  Positive for neck pain. Negative for arthralgias.  Skin:  Negative for rash.  Neurological:  Positive for headaches.    Objective:  BP 133/78   Pulse 61   Temp 98.2 F (36.8 C)   Ht 6\' 3"  (1.905 m)   Wt 202 lb 9.6 oz (91.9 kg)   SpO2 97%   BMI 25.32 kg/m   BP Readings from Last 3 Encounters:  07/25/22 133/78  06/18/22 126/68  05/21/22 127/74    Wt Readings from Last 3 Encounters:  07/25/22 202 lb 9.6 oz (91.9 kg)  06/18/22 209 lb 3.2 oz (94.9 kg)  05/21/22 212 lb 6.4 oz (96.3 kg)     Physical Exam    Assessment & Plan:   Kenneth Maddox was seen today for follow-up.  Diagnoses and all orders for this visit:  Cervical radiculopathy -     pregabalin (LYRICA) 50 MG capsule; Take 3 capsules (150 mg total) by mouth daily. -     Ambulatory referral to Neurosurgery  Other orders -     albuterol (VENTOLIN HFA) 108 (90 Base) MCG/ACT inhaler; Inhale 2 puffs into the lungs every 6 (six) hours as needed for wheezing or shortness of breath. -     Tiotropium Bromide-Olodaterol (STIOLTO RESPIMAT) 2.5-2.5 MCG/ACT AERS; Inhale 2 puffs into the lungs daily. -     fluticasone furoate-vilanterol (BREO ELLIPTA) 100-25 MCG/ACT AEPB; Inhale 1 puff into the lungs  daily.       I have discontinued Kenneth Maddox's Spiriva Respimat. I have also changed his pregabalin. Additionally, I am having him start on albuterol, Stiolto Respimat, and fluticasone furoate-vilanterol. Lastly, I am having him maintain his diclofenac.  Allergies as of 07/25/2022   No Known Allergies      Medication List        Accurate as of July 25, 2022 11:59 PM. If you have any questions, ask your nurse or doctor.          STOP taking these medications    Spiriva Respimat 2.5 MCG/ACT Aers Generic drug: Tiotropium Bromide Monohydrate Stopped by: Mechele Claude, MD       TAKE these medications     albuterol 108 (90 Base) MCG/ACT inhaler Commonly known as: VENTOLIN HFA Inhale 2 puffs into the lungs every 6 (six) hours as needed for wheezing or shortness of breath. Started by: Mechele Claude, MD   diclofenac 75 MG EC tablet Commonly known as: VOLTAREN Take 1 tablet (75 mg total) by mouth 2 (two) times daily. For neck pain and headache   fluticasone furoate-vilanterol 100-25 MCG/ACT Aepb Commonly known as: Breo Ellipta Inhale 1 puff into the lungs daily. Started by: Mechele Claude, MD   pregabalin 50 MG capsule Commonly known as: Lyrica Take 3 capsules (150 mg total) by mouth daily. What changed:  how much to take how to take this when to take this additional instructions Changed by: Mechele Claude, MD   Stiolto Respimat 2.5-2.5 MCG/ACT Aers Generic drug: Tiotropium Bromide-Olodaterol Inhale 2 puffs into the lungs daily. Started by: Mechele Claude, MD         Follow-up: Return in about 6 weeks (around 09/05/2022).  Mechele Claude, M.D.

## 2022-07-28 ENCOUNTER — Encounter: Payer: Self-pay | Admitting: Family Medicine

## 2022-07-29 ENCOUNTER — Telehealth: Payer: Self-pay | Admitting: Family Medicine

## 2022-07-29 ENCOUNTER — Other Ambulatory Visit: Payer: Self-pay | Admitting: Family Medicine

## 2022-07-29 ENCOUNTER — Telehealth: Payer: Self-pay

## 2022-07-29 DIAGNOSIS — M5412 Radiculopathy, cervical region: Secondary | ICD-10-CM

## 2022-07-29 MED ORDER — STIOLTO RESPIMAT 2.5-2.5 MCG/ACT IN AERS: 2.0000 | INHALATION_SPRAY | Freq: Every day | RESPIRATORY_TRACT | 4 refills | Status: AC

## 2022-07-29 MED ORDER — PREGABALIN 50 MG PO CAPS: 150.0000 mg | ORAL_CAPSULE | Freq: Every day | ORAL | 0 refills | Status: DC

## 2022-07-29 NOTE — Telephone Encounter (Signed)
[name] Key: [id number] - Rx #: [id number] Need help? Call us at [phone] Status Sent to [facility] Fluticasone Furoate-Vilanterol 100-25MCG/ACT aerosol powder Form [facility] Electronic PA Form [phone] NCPDP)

## 2022-07-29 NOTE — Telephone Encounter (Signed)
Pts wife called to get update on PA status for pts BREO Rx.  Please advise and call wife.

## 2022-07-29 NOTE — Telephone Encounter (Signed)
I re-sent them to [facility]

## 2022-07-29 NOTE — Telephone Encounter (Signed)
Pts wife called to let PCP know that the pharmacy never received pts LYRICA and STIOLTO RESPIMAT Rx's. Please resend.

## 2022-07-30 NOTE — Telephone Encounter (Signed)
Wife informed

## 2022-08-05 IMAGING — MR MR HEAD WO/W CM
14 of 16 series · 30 of 48 positions shown · IV contrast (gadavist)
Comparison: CT head 11/25/2017.

CLINICAL DATA: Headache, new or worsening (Age >= 50y)

EXAM:
MRI HEAD WITHOUT AND WITH CONTRAST
TECHNIQUE: Multiplanar, multiecho pulse sequences of the brain and surrounding
structures were obtained without and with intravenous contrast.
CONTRAST:  10mL GADAVIST GADOBUTROL 1 MMOL/ML IV SOLN

[Series 5: DWI · axial · 3.0mm · 0.77mm/px · z∈[-77,+70]mm · 2 of 50 slices shown (1 of 6)]
[im 1/50]
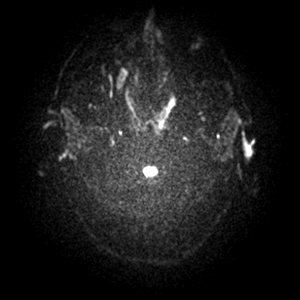
[im 50/50]
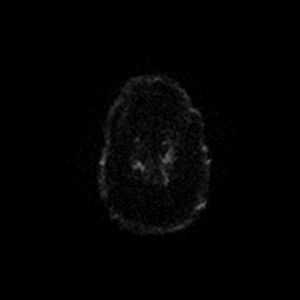

[Series 5: DWI · axial · 3.0mm · 0.77mm/px · z∈[-77,+70]mm · 2 of 50 slices shown (2 of 6)]
[im 1/50]
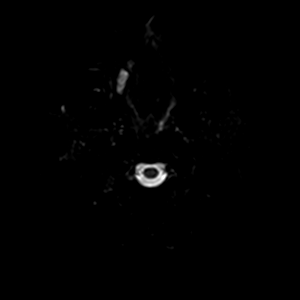
[im 50/50]
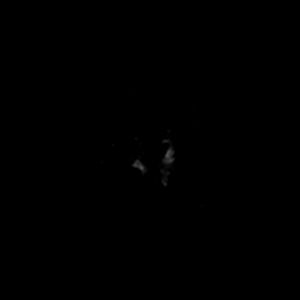

[Series 6: DWI · axial · 3.0mm · 0.77mm/px · z∈[-77,+70]mm · 2 of 50 slices shown (3 of 6)]
[im 1/50]
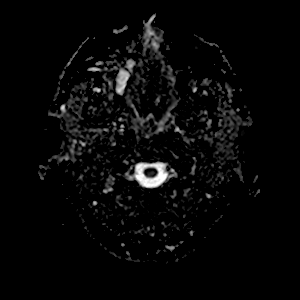
[im 50/50]
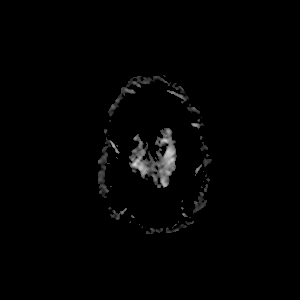

[Series 7: DWI · coronal · 5.0mm · 0.88mm/px · 2 of 28 slices shown (4 of 6)]
[im 1/28]
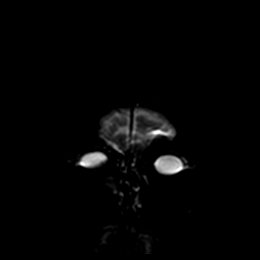
[im 28/28]
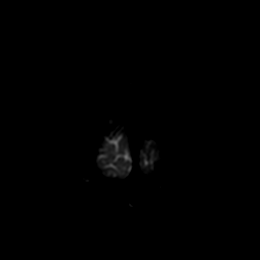

[Series 7: DWI · coronal · 5.0mm · 0.88mm/px · 2 of 28 slices shown (5 of 6)]
[im 1/28]
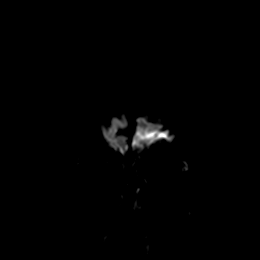
[im 28/28]
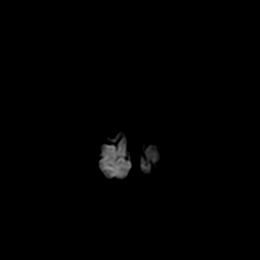

[Series 8: DWI · coronal · 5.0mm · 0.88mm/px · 2 of 28 slices shown (6 of 6)]
[im 1/28]
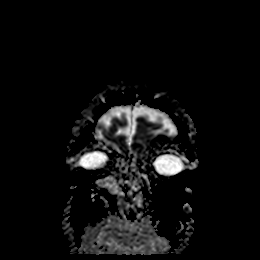
[im 28/28]
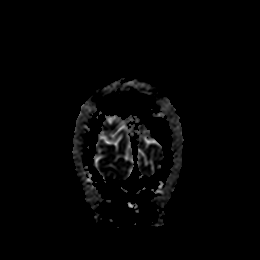

[Series 9: T1 · sagittal · 5.0mm · 0.75mm/px · 1 of 19 slices shown]
[im 1/19]
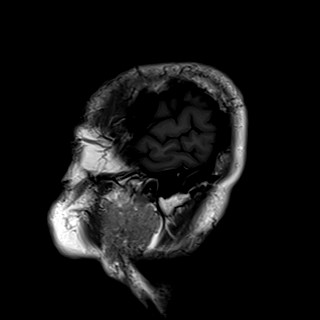

[Series 10: T2 · axial · 5.0mm · 0.72mm/px · 1 of 24 slices shown]
[im 1/24]
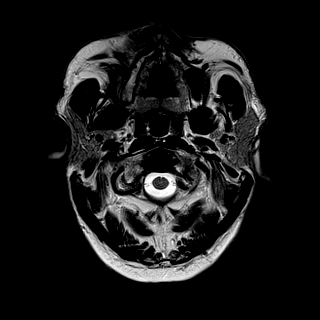

[Series 11: mag_images · axial · 3.0mm · 0.90mm/px · z∈[-83,+94]mm · 3 of 60 slices shown]
[im 1/60]
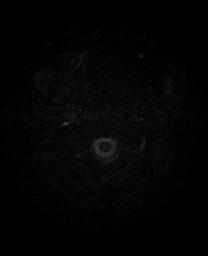
[im 30/60]
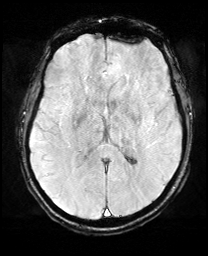
[im 60/60]
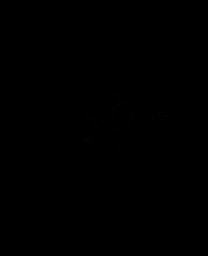

[Series 12: pha_images · axial · 3.0mm · 0.90mm/px · z∈[-83,+85]mm · 3 of 57 slices shown]
[im 1/57]
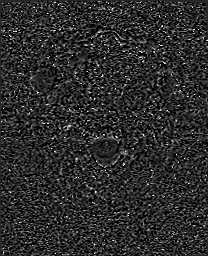
[im 29/57]
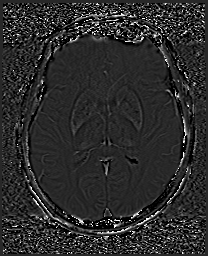
[im 57/57]
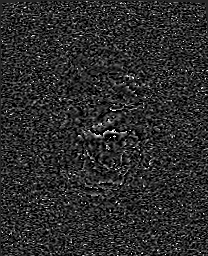

[Series 13: swi_images · axial · 3.0mm · 0.90mm/px · z∈[-83,+94]mm · 3 of 60 slices shown]
[im 1/60]
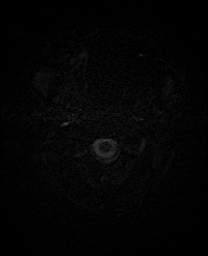
[im 30/60]
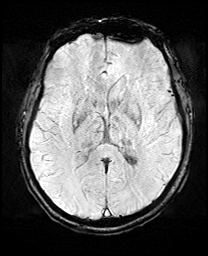
[im 60/60]
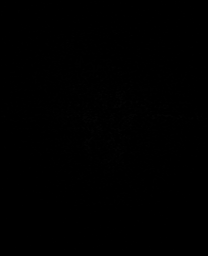

[Series 15: FLAIR · axial · 3.0mm · 0.45mm/px · z∈[-69,+72]mm · 3 of 48 slices shown]
[im 1/48]
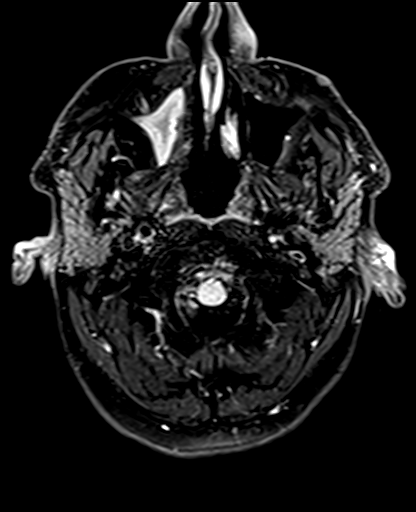
[im 24/48]
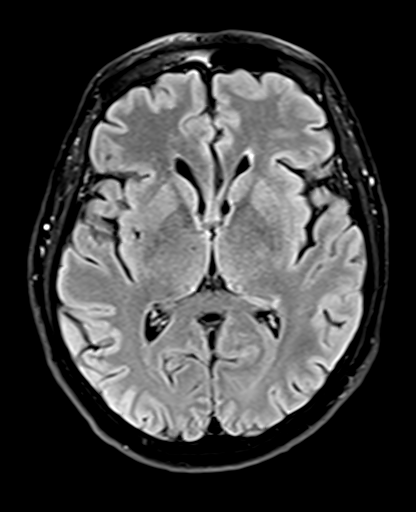
[im 48/48]
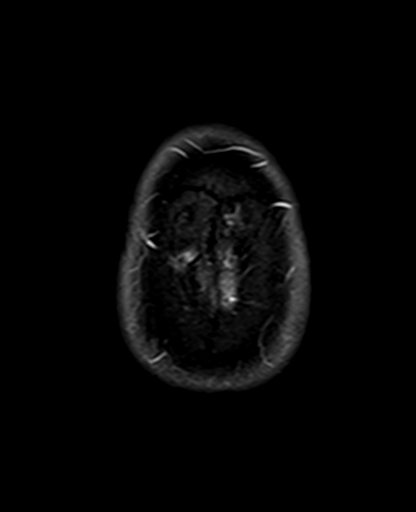

[Series 17: T2 post-contrast · coronal · 5.0mm · 0.72mm/px · 2 of 28 slices shown]
[im 1/28]
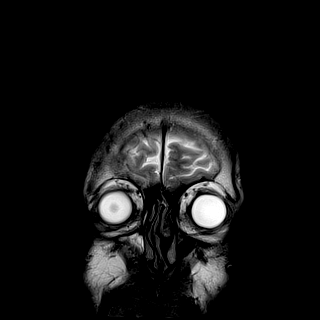
[im 28/28]
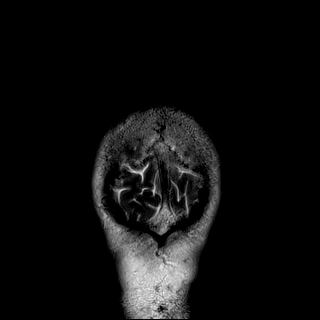

[Series 19: T1 post-contrast · coronal · 5.0mm · 0.34mm/px · 2 of 28 slices shown]
[im 1/28]
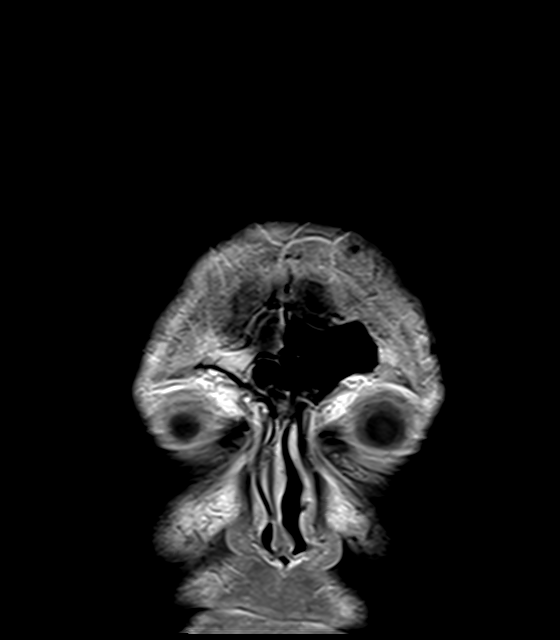
[im 28/28]
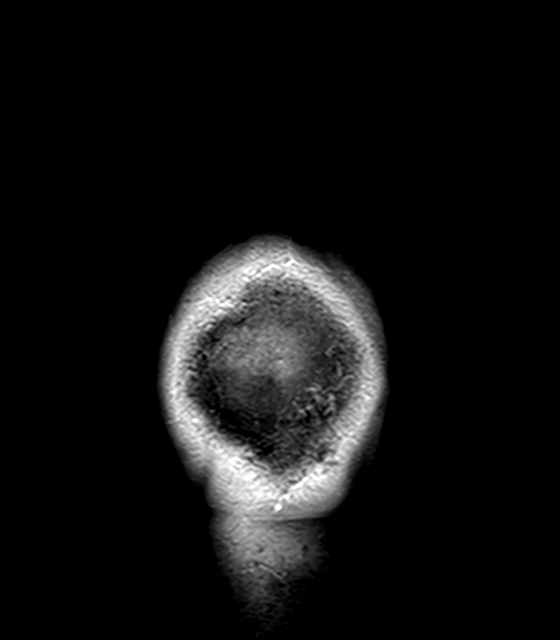

[30 of 48 positions shown; findings below may reference images not displayed]

FINDINGS: Brain: No acute infarction, hemorrhage, hydrocephalus, extra-axial
collection or mass lesion. Mild scattered T2/FLAIR hyperintensities
in the white matter and pons, nonspecific but compatible with
chronic microvascular ischemic disease. No pathologic enhancement

Vascular: Major arterial flow voids are maintained at the skull
base.

Skull and upper cervical spine: Normal marrow signal.

Sinuses/Orbits: Right maxillary sinus mucosal thickening.

Other: No mastoid effusions.
IMPRESSION: 1. No evidence of acute intracranial abnormality.
2. Right maxillary sinus mucosal thickening. Correlate with
signs/symptoms of sinusitis.

## 2022-08-07 ENCOUNTER — Ambulatory Visit: Payer: BC Managed Care – PPO | Admitting: Family Medicine

## 2022-08-07 ENCOUNTER — Telehealth: Payer: Self-pay | Admitting: Family Medicine

## 2022-08-07 ENCOUNTER — Encounter: Payer: Self-pay | Admitting: Family Medicine

## 2022-08-07 NOTE — Telephone Encounter (Signed)
I have no paperwork from/ for him

## 2022-08-07 NOTE — Telephone Encounter (Signed)
LMOVM I have not seen any new FMLA ppw for him. I was asked the other day about PCP or neurosurgeon filling out ppw. We have done ppw in the past for intermittent HA flare-ups. If pt is now out for a continuous leave that the neurosurgeon has taken him out for then they should be the ones to fill out the new FMLA ppw.

## 2022-08-08 ENCOUNTER — Encounter: Payer: Self-pay | Admitting: Family Medicine

## 2022-08-08 ENCOUNTER — Telehealth: Payer: Self-pay | Admitting: Family Medicine

## 2022-08-08 ENCOUNTER — Ambulatory Visit: Payer: BC Managed Care – PPO | Admitting: Family Medicine

## 2022-08-08 VITALS — BP 123/76 | HR 72 | Temp 98.0°F | Ht 75.0 in | Wt 204.6 lb

## 2022-08-08 DIAGNOSIS — G43711 Chronic migraine without aura, intractable, with status migrainosus: Secondary | ICD-10-CM

## 2022-08-08 DIAGNOSIS — M5412 Radiculopathy, cervical region: Secondary | ICD-10-CM

## 2022-08-08 MED ORDER — SUMATRIPTAN SUCCINATE 100 MG PO TABS: 100.0000 mg | ORAL_TABLET | ORAL | 5 refills | Status: DC | PRN

## 2022-08-08 MED ORDER — PREGABALIN 150 MG PO CAPS
150.0000 mg | ORAL_CAPSULE | ORAL | 2 refills | Status: DC
Start: 2022-08-08 — End: 2022-09-10

## 2022-08-08 NOTE — Telephone Encounter (Signed)
Fax has not been received yet. Pt had appt today 08/08/22 w/ Dr. Darlyn Read regarding this.

## 2022-08-08 NOTE — Progress Notes (Signed)
Subjective:  Patient ID: Kenneth Maddox, male    DOB: 1961/10/29  Age: 61 y.o. MRN: [id number]  CC: Headache   HPI Kenneth Maddox presents for off of lyrica because it is keeping him awake. Off of diclofenac, not clear why. HA 3-4 /10 for 10 days. With cough goes to 10/10 and stays there. Can't work with the pain. Out of work since 8/21. Needs ST disabiity. Still waiting on the paperwork. Hasn't been received from his HR provider, [employer].   HEadache is intractable, radiating from the neck. States it feel different than past migraines.  Concerned that Lyrica keeps him awake at night so he has not been taking it.  Not taking the diclofenac.  No explanation given.     08/08/2022    2:18 PM 07/25/2022   10:49 AM 06/18/2022    2:27 PM  Depression screen PHQ 2/9  Decreased Interest 0 0 0  Down, Depressed, Hopeless 0 0 0  PHQ - 2 Score 0 0 0  Altered sleeping  1 3  Tired, decreased energy  3 3  Change in appetite  0 0  Feeling bad or failure about yourself   0 0  Trouble concentrating  0 0  Moving slowly or fidgety/restless  0 0  Suicidal thoughts  0 0  PHQ-9 Score  4 6  Difficult doing work/chores  Somewhat difficult Very difficult    History Kenneth Maddox has a past medical history of Complication of anesthesia, COPD (chronic obstructive pulmonary disease) (HCC), Coronary artery disease, Hyperlipidemia LDL goal < 70, Hypertension, Hypertriglyceridemia (01/27/2016), Myocardial infarction (HCC), PONV (postoperative nausea and vomiting), Presence of stent in left circumflex coronary artery (06/2010), and Thyroid disease.   He has a past surgical history that includes Coronary stent placement; External ear surgery; left heart catheterization with coronary angiogram (N/A, 02/26/2012); laparoscopic appendectomy (N/A, 11/16/2015); Appendectomy; and LEFT HEART CATH AND CORONARY ANGIOGRAPHY (N/A, 02/12/2018).   His family history includes Diabetes in his daughter and father; Leukemia in his mother.He reports  that he has been smoking cigarettes. He has a 40.00 pack-year smoking history. He has never used smokeless tobacco. He reports that he does not drink alcohol and does not use drugs.    ROS Review of Systems  Constitutional:  Negative for fever.  Respiratory:  Negative for shortness of breath.   Cardiovascular:  Negative for chest pain.  Musculoskeletal:  Negative for arthralgias.  Skin:  Negative for rash.  Neurological:  Positive for headaches.    Objective:  BP 123/76   Pulse 72   Temp 98 F (36.7 C)   Ht 6\' 3"  (1.905 m)   Wt 204 lb 9.6 oz (92.8 kg)   SpO2 99%   BMI 25.57 kg/m   BP Readings from Last 3 Encounters:  08/08/22 123/76  07/25/22 133/78  06/18/22 126/68    Wt Readings from Last 3 Encounters:  08/08/22 204 lb 9.6 oz (92.8 kg)  07/25/22 202 lb 9.6 oz (91.9 kg)  06/18/22 209 lb 3.2 oz (94.9 kg)     Physical Exam Vitals reviewed.  Constitutional:      Appearance: He is well-developed.  HENT:     Head: Normocephalic and atraumatic.     Right Ear: External ear normal.     Left Ear: External ear normal.     Mouth/Throat:     Pharynx: No oropharyngeal exudate or posterior oropharyngeal erythema.  Eyes:     Pupils: Pupils are equal, round, and reactive to light.  Cardiovascular:  Rate and Rhythm: Normal rate and regular rhythm.     Heart sounds: No murmur heard. Pulmonary:     Effort: No respiratory distress.     Breath sounds: Normal breath sounds.  Musculoskeletal:     Cervical back: Normal range of motion and neck supple.  Neurological:     Mental Status: He is alert and oriented to person, place, and time.       Assessment & Plan:   Kenneth Maddox was seen today for headache.  Diagnoses and all orders for this visit:  Intractable chronic migraine without aura and with status migrainosus  Cervical radiculopathy -     pregabalin (LYRICA) 150 MG capsule; Take 1 capsule (150 mg total) by mouth every morning.  Other orders -     SUMAtriptan  (IMITREX) 100 MG tablet; Take 1 tablet (100 mg total) by mouth every 2 (two) hours as needed for migraine. May repeat in 2 hours if headache persists or recurs.       I have changed Kenneth Maddox's pregabalin. I am also having him start on SUMAtriptan. Additionally, I am having him maintain his diclofenac, albuterol, fluticasone furoate-vilanterol, and Stiolto Respimat.  Allergies as of 08/08/2022   No Known Allergies      Medication List        Accurate as of August 08, 2022  4:28 PM. If you have any questions, ask your nurse or doctor.          albuterol 108 (90 Base) MCG/ACT inhaler Commonly known as: VENTOLIN HFA Inhale 2 puffs into the lungs every 6 (six) hours as needed for wheezing or shortness of breath.   diclofenac 75 MG EC tablet Commonly known as: VOLTAREN Take 1 tablet (75 mg total) by mouth 2 (two) times daily. For neck pain and headache   fluticasone furoate-vilanterol 100-25 MCG/ACT Aepb Commonly known as: Breo Ellipta Inhale 1 puff into the lungs daily.   pregabalin 150 MG capsule Commonly known as: Lyrica Take 1 capsule (150 mg total) by mouth every morning. What changed:  medication strength when to take this Changed by: Mechele Claude, MD   Stiolto Respimat 2.5-2.5 MCG/ACT Aers Generic drug: Tiotropium Bromide-Olodaterol Inhale 2 puffs into the lungs daily.   SUMAtriptan 100 MG tablet Commonly known as: IMITREX Take 1 tablet (100 mg total) by mouth every 2 (two) hours as needed for migraine. May repeat in 2 hours if headache persists or recurs. Started by: Mechele Claude, MD         Follow-up: Return in about 1 month (around 09/07/2022).  Mechele Claude, M.D.

## 2022-08-08 NOTE — Telephone Encounter (Signed)
Has appointment in office today

## 2022-08-14 NOTE — Telephone Encounter (Signed)
Paper work giving back today and faxed to [employer] Left VM

## 2022-08-16 NOTE — Telephone Encounter (Signed)
Your case has been denied based on the information in the patient's profile.

## 2022-09-10 ENCOUNTER — Ambulatory Visit: Payer: BC Managed Care – PPO | Admitting: Family Medicine

## 2022-09-10 ENCOUNTER — Encounter: Payer: Self-pay | Admitting: Family Medicine

## 2022-09-10 DIAGNOSIS — M5412 Radiculopathy, cervical region: Secondary | ICD-10-CM | POA: Diagnosis not present

## 2022-09-10 MED ORDER — PREGABALIN 75 MG PO CAPS: 75.0000 mg | ORAL_CAPSULE | Freq: Two times a day (BID) | ORAL | 1 refills | Status: AC

## 2022-09-10 MED ORDER — DICLOFENAC SODIUM 75 MG PO TBEC
75.0000 mg | DELAYED_RELEASE_TABLET | Freq: Two times a day (BID) | ORAL | 1 refills | Status: AC
Start: 2022-09-10 — End: ?

## 2022-09-10 NOTE — Progress Notes (Signed)
Subjective:  Patient ID: Kenneth Maddox, male    DOB: [birth year]  Age: 62 y.o. MRN: [id number]  CC: Follow-up and Migraine (/)   HPI Kenneth Maddox presents for HA follow up. HA is still q 2-3 days for a whole day. Taking lyrica 100 qhs. 8/10 pain with each episode.      09/10/2022    3:04 PM 08/08/2022    2:18 PM 07/25/2022   10:49 AM  Depression screen PHQ 2/9  Decreased Interest 0 0 0  Down, Depressed, Hopeless 0 0 0  PHQ - 2 Score 0 0 0  Altered sleeping   1  Tired, decreased energy   3  Change in appetite   0  Feeling bad or failure about yourself    0  Trouble concentrating   0  Moving slowly or fidgety/restless   0  Suicidal thoughts   0  PHQ-9 Score   4  Difficult doing work/chores   Somewhat difficult    History Kenneth Maddox has a past medical history of Complication of anesthesia, COPD (chronic obstructive pulmonary disease) ([place]), Coronary artery disease, Hyperlipidemia LDL goal < 70, Hypertension, Hypertriglyceridemia (01/27/2016), Myocardial infarction ([place]), PONV (postoperative nausea and vomiting), Presence of stent in left circumflex coronary artery (06/2010), and Thyroid disease.   He has a past surgical history that includes Coronary stent placement; External ear surgery; left heart catheterization with coronary angiogram (N/A, 02/26/2012); laparoscopic appendectomy (N/A, 11/16/2015); Appendectomy; and LEFT HEART CATH AND CORONARY ANGIOGRAPHY (N/A, 02/12/2018).   His family history includes Diabetes in his daughter and father; Leukemia in his mother.He reports that he has been smoking cigarettes. He has a 40.00 pack-year smoking history. He has never used smokeless tobacco. He reports that he does not drink alcohol and does not use drugs.    ROS Review of Systems  Constitutional:  Negative for fever.  Respiratory:  Negative for shortness of breath.   Cardiovascular:  Negative for chest pain.  Musculoskeletal:  Negative for arthralgias.  Skin:  Negative for rash.   Neurological:  Positive for headaches.    Objective:  BP 135/83   Pulse 77   Temp 98.2 F (36.8 C)   Ht 6\' 3"  (1.905 m)   Wt 218 lb 12.8 oz (99.2 kg)   SpO2 94%   BMI 27.35 kg/m   BP Readings from Last 3 Encounters:  09/10/22 135/83  08/08/22 123/76  07/25/22 133/78    Wt Readings from Last 3 Encounters:  09/10/22 218 lb 12.8 oz (99.2 kg)  08/08/22 204 lb 9.6 oz (92.8 kg)  07/25/22 202 lb 9.6 oz (91.9 kg)     Physical Exam Vitals reviewed.  Constitutional:      Appearance: He is well-developed.  HENT:     Head: Normocephalic and atraumatic.     Right Ear: External ear normal.     Left Ear: External ear normal.     Mouth/Throat:     Pharynx: No oropharyngeal exudate or posterior oropharyngeal erythema.  Eyes:     Pupils: Pupils are equal, round, and reactive to light.  Cardiovascular:     Rate and Rhythm: Normal rate and regular rhythm.     Heart sounds: No murmur heard. Pulmonary:     Effort: No respiratory distress.     Breath sounds: Normal breath sounds.  Musculoskeletal:     Cervical back: Normal range of motion and neck supple.  Neurological:     Mental Status: He is alert and oriented to person, place, and time.  Assessment & Plan:   Kenneth Maddox was seen today for follow-up and migraine.  Diagnoses and all orders for this visit:  Cervical radiculopathy -     pregabalin (LYRICA) 75 MG capsule; Take 1 capsule (75 mg total) by mouth 2 (two) times daily.  Other orders -     diclofenac (VOLTAREN) 75 MG EC tablet; Take 1 tablet (75 mg total) by mouth 2 (two) times daily. For neck pain and headache       I have discontinued Kenneth Maddox's fluticasone furoate-vilanterol and SUMAtriptan. I have also changed his pregabalin. Additionally, I am having him maintain his albuterol, Stiolto Respimat, and diclofenac.  Allergies as of 09/10/2022   No Known Allergies      Medication List        Accurate as of September 10, 2022  9:14 PM. If you have  any questions, ask your nurse or doctor.          STOP taking these medications    fluticasone furoate-vilanterol 100-25 MCG/ACT Aepb Commonly known as: Breo Ellipta Stopped by: Mechele Claude, MD   SUMAtriptan 100 MG tablet Commonly known as: IMITREX Stopped by: Mechele Claude, MD       TAKE these medications    albuterol 108 (90 Base) MCG/ACT inhaler Commonly known as: VENTOLIN HFA Inhale 2 puffs into the lungs every 6 (six) hours as needed for wheezing or shortness of breath.   diclofenac 75 MG EC tablet Commonly known as: VOLTAREN Take 1 tablet (75 mg total) by mouth 2 (two) times daily. For neck pain and headache   pregabalin 75 MG capsule Commonly known as: Lyrica Take 1 capsule (75 mg total) by mouth 2 (two) times daily. What changed:  medication strength how much to take when to take this Changed by: Mechele Claude, MD   Stiolto Respimat 2.5-2.5 MCG/ACT Aers Generic drug: Tiotropium Bromide-Olodaterol Inhale 2 puffs into the lungs daily.         Follow-up: Return in about 6 weeks (around 10/22/2022).  Mechele Claude, M.D.

## 2022-09-11 ENCOUNTER — Telehealth: Payer: Self-pay | Admitting: Family Medicine

## 2022-09-11 ENCOUNTER — Encounter: Payer: Self-pay | Admitting: *Deleted

## 2022-09-11 NOTE — Telephone Encounter (Signed)
What day does he want to RTW?

## 2022-09-11 NOTE — Telephone Encounter (Signed)
Oct 16. Will provide note per Dr Livia Snellen

## 2023-03-28 ENCOUNTER — Other Ambulatory Visit: Payer: Self-pay | Admitting: Family Medicine

## 2023-03-28 DIAGNOSIS — M5412 Radiculopathy, cervical region: Secondary | ICD-10-CM

## 2024-10-11 ENCOUNTER — Ambulatory Visit (HOSPITAL_BASED_OUTPATIENT_CLINIC_OR_DEPARTMENT_OTHER): Admitting: Cardiovascular Disease

## 2024-10-15 ENCOUNTER — Ambulatory Visit (HOSPITAL_BASED_OUTPATIENT_CLINIC_OR_DEPARTMENT_OTHER): Admitting: Cardiovascular Disease

## 2024-12-23 NOTE — Progress Notes (Unsigned)
 "  Referring Physician:  [facility] [address] [place],  [place] [address]  Primary Physician:  Zollie Lowers, MD  History of Present Illness: 12/23/2024*** Mr. Kenneth Maddox has a history of CAD with stents, MI, HTN, COPD, centrilobular emphysema, thyroid disease, hyperlipidemia, hypertriglyceridemia.   Back pain? Right leg pain  Duration: *** Location: *** Quality: *** Severity: ***  Precipitating: aggravated by *** Modifying factors: made better by *** Weakness: none Timing: ***  Tobacco use: smokes 1 PPD x 40+ years.    Bowel/Bladder Dysfunction: none  Conservative measures:  Physical therapy: *** has not participated in for his leg pain?? Multimodal medical therapy including regular antiinflammatories: *** Voltaren  gel, Lyrica  Injections: *** 09/04/2022 MBB Right C3-C5 08/19/2022-MBB Right C3-C5  Past Surgery: ***no spine surgeries  Joshia Kitchings has ***no symptoms of cervical myelopathy.  The symptoms are causing a significant impact on the patient's life.   Review of Systems:  A 10 point review of systems is negative, except for the pertinent positives and negatives detailed in the HPI.  Past Medical History: Past Medical History:  Diagnosis Date   Complication of anesthesia    COPD (chronic obstructive pulmonary disease) (HCC)    Coronary artery disease    Hyperlipidemia LDL goal < 70    Hypertension    Hypertriglyceridemia 01/27/2016   Myocardial infarction (HCC)    PONV (postoperative nausea and vomiting)    Presence of stent in left circumflex coronary artery 06/2010   Ion EES 3.0 mm x 16 mm; proximal left circumflex   Thyroid disease     Past Surgical History: Past Surgical History:  Procedure Laterality Date   APPENDECTOMY     CORONARY STENT PLACEMENT     EXTERNAL EAR SURGERY     LAPAROSCOPIC APPENDECTOMY N/A 11/16/2015   Procedure: APPENDECTOMY LAPAROSCOPIC;  Surgeon: Oneil Budge, MD;  Location: [facility];  Service:  General;  Laterality: N/A;   LEFT HEART CATH AND CORONARY ANGIOGRAPHY N/A 02/12/2018   Procedure: LEFT HEART CATH AND CORONARY ANGIOGRAPHY;  Surgeon: Dann Candyce RAMAN, MD;  Location: [facility]AS[facility];  Service: Cardiovascular;  Laterality: N/A;   LEFT HEART CATHETERIZATION WITH CORONARY ANGIOGRAM N/A 02/26/2012   Procedure: LEFT HEART CATHETERIZATION WITH CORONARY ANGIOGRAM;  Surgeon: Alm LELON Clay, MD;  Location: [facility] CATH LAB;  Service: Cardiovascular;  Laterality: N/A;    Allergies: Allergies as of 12/28/2024   (No Known Allergies)    Medications: Outpatient Encounter Medications as of 12/28/2024  Medication Sig   albuterol  (VENTOLIN  HFA) 108 (90 Base) MCG/ACT inhaler Inhale 2 puffs into the lungs every 6 (six) hours as needed for wheezing or shortness of breath.   diclofenac  (VOLTAREN ) 75 MG EC tablet Take 1 tablet (75 mg total) by mouth 2 (two) times daily. For neck pain and headache   pregabalin  (LYRICA ) 75 MG capsule Take 1 capsule (75 mg total) by mouth 2 (two) times daily.   Tiotropium Bromide-Olodaterol (STIOLTO RESPIMAT ) 2.5-2.5 MCG/ACT AERS Inhale 2 puffs into the lungs daily.   No facility-administered encounter medications on file as of 12/28/2024.    Social History: Social History[1]  Family Medical History: Family History  Problem Relation Age of Onset   Leukemia Mother    Diabetes Father    Diabetes Daughter     Physical Examination: There were no vitals filed for this visit.  General: Patient is well developed, well nourished, calm, collected, and in no apparent distress. Attention to examination is appropriate.  Respiratory: Patient  is breathing without any difficulty.   NEUROLOGICAL:     Awake, alert, oriented to person, place, and time.  Speech is clear and fluent. Fund of knowledge is appropriate.   Cranial Nerves: Pupils equal round and reactive to light.  Facial tone is symmetric.    *** ROM of cervical spine *** pain *** posterior cervical  tenderness. *** tenderness in bilateral trapezial region.   *** ROM of lumbar spine *** pain *** posterior lumbar tenderness.   No abnormal lesions on exposed skin.   Strength: Side Biceps Triceps Deltoid Interossei Grip Wrist Ext. Wrist Flex.  R 5 5 5 5 5 5 5   L 5 5 5 5 5 5 5    Side Iliopsoas Quads Hamstring PF DF EHL  R 5 5 5 5 5 5   L 5 5 5 5 5 5    Reflexes are ***2+ and symmetric at the biceps, brachioradialis, patella and achilles.   Hoffman's is absent.  Clonus is not present.   Bilateral upper and lower extremity sensation is intact to light touch.     Gait is normal.   ***No difficulty with tandem gait.    Medical Decision Making  Imaging: Cervical MRI dated 06/12/22:  FINDINGS: Alignment: Straightening of the cervical lordosis. 3 mm retrolisthesis C6-7   Vertebrae: Negative for fracture or mass   Cord: Normal signal and morphology   Posterior Fossa, vertebral arteries, paraspinal tissues: Negative   Disc levels:   C2-3: Negative   C3-4: Mild left foraminal narrowing due to spurring. Small central disc protrusion. Central canal patent.   C4-5: Shallow broad-based central disc protrusion with associated spurring. Cord flattening with mild spinal stenosis. Mild foraminal narrowing bilaterally due to spurring.   C5-6: Disc degeneration with diffuse uncinate spurring. Cord flattening with mild spinal stenosis. Moderate to severe foraminal narrowing bilaterally due to spurring   C6-7: Disc degeneration with diffuse uncinate spurring. Mild central canal stenosis. Moderate foraminal narrowing bilaterally due to spurring   C7-T1: Negative   IMPRESSION: Multilevel cervical spondylosis causing spinal and foraminal stenosis as above   No cord compression identified. Negative for cervical spine fracture     Electronically Signed   By: Carlin Gaskins M.D.   On: 06/12/2022 13:29  I have personally reviewed the images and agree with the above  interpretation.  Assessment and Plan: Mr. Andrus is a pleasant 64 y.o. male has ***  Treatment options discussed with patient and following plan made:   - Order for physical therapy for *** spine ***. Patient to call to schedule appointment. *** - Continue current medications including ***. Reviewed dosing and side effects.  - Prescription for ***. Reviewed dosing and side effects. Take with food.  - Prescription for *** to take prn muscle spasms. Reviewed dosing and side effects. Discussed this can cause drowsiness.  - MRI of *** to further evaluate *** radiculopathy. No improvement time or medications (***).  - Referral to PMR at [facility] to discuss possible *** injections.  - Will schedule phone visit to review MRI results once I get them back.   I spent a total of *** minutes in face-to-face and non-face-to-face activities related to this patient's care today including review of outside records, review of imaging, review of symptoms, physical exam, discussion of differential diagnosis, discussion of treatment options, and documentation.   Thank you for involving me in the care of this patient.   Glade Boys PA-C Dept. of Neurosurgery      [1]  Social History Tobacco  Use   Smoking status: Every Day    Current packs/day: 1.00    Average packs/day: 1 pack/day for 40.0 years (40.0 ttl pk-yrs)    Types: Cigarettes   Smokeless tobacco: Never  Vaping Use   Vaping status: Never Used  Substance Use Topics   Alcohol use: No   Drug use: No   "

## 2024-12-28 ENCOUNTER — Ambulatory Visit: Admitting: Orthopedic Surgery
